# Patient Record
Sex: Female | Born: 2000 | Hispanic: No | Marital: Single | Smoking: Current some day smoker
Health system: Southern US, Community
[De-identification: ages and names within clinical notes are randomized; demographics above are authoritative.]

## PROBLEM LIST (undated history)

## (undated) DIAGNOSIS — L309 Dermatitis, unspecified: Secondary | ICD-10-CM

## (undated) DIAGNOSIS — J069 Acute upper respiratory infection, unspecified: Secondary | ICD-10-CM

## (undated) DIAGNOSIS — T783XXA Angioneurotic edema, initial encounter: Secondary | ICD-10-CM

## (undated) HISTORY — DX: Acute upper respiratory infection, unspecified: J06.9

## (undated) HISTORY — DX: Dermatitis, unspecified: L30.9

## (undated) HISTORY — DX: Angioneurotic edema, initial encounter: T78.3XXA

---
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
## 2021-01-23 ENCOUNTER — Emergency Department (HOSPITAL_COMMUNITY)
Admission: EM | Admit: 2021-01-23 | Discharge: 2021-01-23 | Disposition: A | Discharge status: Home / Self Care | Payer: Medicaid Other | Attending: Emergency Medicine | Admitting: Emergency Medicine

## 2021-01-23 ENCOUNTER — Encounter (HOSPITAL_COMMUNITY): Payer: Self-pay | Admitting: Pharmacy Technician

## 2021-01-23 ENCOUNTER — Other Ambulatory Visit: Payer: Self-pay

## 2021-01-23 DIAGNOSIS — R519 Headache, unspecified: Secondary | ICD-10-CM | POA: Diagnosis not present

## 2021-01-23 DIAGNOSIS — G43909 Migraine, unspecified, not intractable, without status migrainosus: Secondary | ICD-10-CM | POA: Diagnosis not present

## 2021-01-23 DIAGNOSIS — F17200 Nicotine dependence, unspecified, uncomplicated: Secondary | ICD-10-CM | POA: Diagnosis not present

## 2021-01-23 DIAGNOSIS — R42 Dizziness and giddiness: Secondary | ICD-10-CM | POA: Diagnosis not present

## 2021-01-23 DIAGNOSIS — H538 Other visual disturbances: Secondary | ICD-10-CM | POA: Diagnosis not present

## 2021-01-23 DIAGNOSIS — G4489 Other headache syndrome: Secondary | ICD-10-CM | POA: Diagnosis not present

## 2021-01-23 LAB — I-STAT BETA HCG BLOOD, ED (MC, WL, AP ONLY): I-stat hCG, quantitative: <5 m[IU]/mL (ref <5)

## 2021-01-23 MED ORDER — ACETAMINOPHEN 325 MG PO TABS
650.0000 mg | ORAL_TABLET | Freq: Four times a day (QID) | ORAL | Status: DC | PRN
  Administered 2021-01-23: 650 mg via ORAL
  Filled 2021-01-23: qty 2

## 2021-01-23 NOTE — Discharge Instructions (Addendum)
Tylenol for headache.  Drink plenty of water

## 2021-01-23 NOTE — ED Provider Notes (Signed)
Emergency Medicine Provider Triage Evaluation Note  April Lara , a 20 y.o. female  was evaluated in triage.  Pt complains of right-sided dull headache that has waxed and waned since yesterday. Denies visual changes, speech changes, and unilateral weakness. No history of headaches/migraines. No fever. No neck stiffness. No recent head trauma. Denies any numbness/tingling. She has been taking Tylenol with mild relief, last dose 3PM yesterday.  Review of Systems  Positive: headache Negative: fever  Physical Exam  There were no vitals taken for this visit. Gen:   Awake, no distress   Resp:  Normal effort  MSK:   Moves extremities without difficulty  Other:    Medical Decision Making  Medically screening exam initiated at 12:44 PM.  Appropriate orders placed.  Lashundra Shiveley was informed that the remainder of the evaluation will be completed by another provider, this initial triage assessment does not replace that evaluation, and the importance of remaining in the ED until their evaluation is complete.  Headache. Neurological exam grossly normal; however will need more thorough neurological exam when in a room. Pregnancy test for migraine cocktail in the back. Low suspicion for [facility] or emergent intracranial etiology.    Mannie Stabile, PA-C 01/23/21 1246    Arby Barrette, MD 02/03/21 1234

## 2021-01-23 NOTE — ED Notes (Signed)
Pt A&ox4, ambulatory at d/c with independent steady gait, NAD. Pt verbalized understanding of d/c instructions and follow up care. 

## 2021-01-23 NOTE — ED Provider Notes (Signed)
[facility] EMERGENCY DEPARTMENT Provider Note   CSN: [id number] Arrival date & time: 01/23/21  1216     History No chief complaint on file.   April Lara is a 20 y.o. female.  The history is provided by the patient. No language interpreter was used.  Headache Pain location:  Generalized Radiates to:  Does not radiate Onset quality:  Gradual Timing:  Constant Progression:  Worsening Chronicity:  New Ineffective treatments:  None tried Pt reports she had a headache yesterday and took tylenol.,  Pt reports headahce again today.  Pt reports her teacher called EMS.     History reviewed. No pertinent past medical history.  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 No pertinent surgical history.   OB History   No obstetric history on file.     No family history on file.  Social History   Tobacco Use   Smoking status: Some Days    Pack years: 0.00   Smokeless tobacco: Current   Tobacco comments:    vapes  Substance Use Topics   Alcohol use: Never   Drug use: Never    Home Medications Prior to Admission medications   Medication Sig Start Date End Date Taking? Authorizing Provider  cetirizine (ZYRTEC) 10 MG tablet TAKE 1 TABLET BY MOUTH EVERY DAY AS NEEDED FOR ALLERGY 12/10/20   Lady Deutscher, MD  fluticasone [facility]) 50 MCG/ACT nasal spray Place 1-2 sprays into both nostrils daily. 1 spray in each nostril every day Patient not taking: Reported on 11/26/2020 04/13/20   Romeo Apple, MD  JUNEL FE 1.5/30 1.5-30 MG-MCG tablet TAKE 1 TABLET BY  MOUTH EVERY DAY 10/08/20   Verneda Skill, FNP  triamcinolone ointment (KENALOG) 0.1 % Apply 1 application topically 2 (two) times daily. Apply over rough patches of skin, for no more than 14 days total Patient not taking: Reported on 11/26/2020 04/13/20   Romeo Apple, MD    Allergies    Patient has no known allergies.  Review of Systems   Review of Systems  Neurological:  Positive for headaches.  All other systems reviewed and are negative.  Physical Exam Updated Vital Signs BP 115/82 (BP Location: Right Arm)   Pulse 81   Temp 98.8 F (37.1 C)   Resp 17   SpO2 100%   Physical Exam Vitals and nursing note reviewed.  Constitutional:      Appearance: She is well-developed.  HENT:     Head: Normocephalic.     Right Ear: Tympanic membrane normal.     Left Ear: Tympanic membrane normal.     Nose: Nose normal.     Mouth/Throat:     Mouth: Mucous membranes are moist.  Eyes:     Extraocular Movements: Extraocular movements intact.     Pupils: Pupils are equal, round, and reactive to light.  Cardiovascular:     Rate and Rhythm: Normal rate and regular rhythm.  Pulmonary:     Effort: Pulmonary effort is normal.  Abdominal:     General: Abdomen is flat. There is no distension.  Musculoskeletal:  General: Normal range of motion.     Cervical back: Normal range of motion.  Skin:    General: Skin is warm.  Neurological:     General: No focal deficit present.     Mental Status: She is alert and oriented to person, place, and time.    ED Results / Procedures / Treatments   Labs (all labs ordered are listed, but only abnormal results are displayed) Labs Reviewed  I-STAT BETA HCG BLOOD, ED ([facility], [facility], [facility] ONLY)    EKG None  Radiology No results found.  Procedures Procedures   Medications Ordered in ED Medications  acetaminophen (TYLENOL) tablet 650 mg (650 mg Oral Given 01/23/21 1718)    ED Course  I have reviewed the triage vital signs and the nursing  notes.  Pertinent labs & imaging results that were available during my care of the patient were reviewed by me and considered in my medical decision making (see chart for details).    MDM Rules/Calculators/A&P                          MDM:  Pt looks good.  Pt given tylenol.  I advised follow up with primary care  Final Clinical Impression(s) / ED Diagnoses Final diagnoses:  Nonintractable headache, unspecified chronicity pattern, unspecified headache type    Rx / DC Orders ED Discharge Orders     None        Osie Cheeks 01/23/21 2330    Lorre Nick, MD 01/24/21 2254

## 2021-01-23 NOTE — ED Triage Notes (Addendum)
Pt bib EMS here with headache onset yesterday. Pt states numbness to R face along with dizziness and blurred vision to R eye yesterday. Today pt states headache is still there but numbness has resolved except for her R eyebrow. Pt with hx of same type of headaches but has never been evaluated by a provider.

## 2021-01-23 NOTE — ED Provider Notes (Signed)
[facility] EMERGENCY DEPARTMENT Provider Note   CSN: [id number] Arrival date & time: 01/23/21  1216     History No chief complaint on file.   April Lara is a 20 y.o. female.  Pt reports she had a headache yesterday.  Pt reports headache resolved with tylenol. Pt reports she developed a headache at school today.  Pt reports her teacher called EMS.  Pt states she just wants to go home.  Pt denies any weakness. No visual change.   The history is provided by the patient. No language interpreter was used.  Headache Pain location:  Generalized Radiates to:  Does not radiate Severity currently:  Unable to specify Severity at highest:  Unable to specify Timing:  Constant Chronicity:  New Relieved by:  Nothing Worsened by:  Nothing Ineffective treatments:  None tried Associated symptoms: no URI       History reviewed. No pertinent past medical history.  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 No pertinent surgical history.   OB History   No obstetric history on file.     No family history on file.  Social History   Tobacco Use   Smoking status: Some Days    Pack years: 0.00   Smokeless tobacco: Current   Tobacco comments:    vapes  Substance Use Topics   Alcohol use: Never   Drug use: Never    Home Medications Prior to Admission medications   Medication Sig Start Date End Date Taking? Authorizing Provider  cetirizine (ZYRTEC) 10 MG tablet TAKE 1 TABLET BY MOUTH EVERY DAY AS NEEDED FOR ALLERGY 12/10/20   Lady Deutscher, MD  fluticasone  [facility]) 50 MCG/ACT nasal spray Place 1-2 sprays into both nostrils daily. 1 spray in each nostril every day Patient not taking: Reported on 11/26/2020 04/13/20   Romeo Apple, MD  JUNEL FE 1.5/30 1.5-30 MG-MCG tablet TAKE 1 TABLET BY MOUTH EVERY DAY 10/08/20   Verneda Skill, FNP  triamcinolone ointment (KENALOG) 0.1 % Apply 1 application topically 2 (two) times daily. Apply over rough patches of skin, for no more than 14 days total Patient not taking: Reported on 11/26/2020 04/13/20   Romeo Apple, MD    Allergies    Patient has no known allergies.  Review of Systems   Review of Systems  Neurological:  Positive for headaches.  All other systems reviewed and are negative.  Physical Exam Updated Vital Signs BP 115/82 (BP Location: Right Arm)   Pulse 81   Temp 98.8 F (37.1 C)   Resp 17   SpO2 100%   Physical Exam Vitals and nursing note reviewed.  Constitutional:      Appearance: She is well-developed.  HENT:     Head: Normocephalic.  Cardiovascular:     Rate and Rhythm: Normal rate and regular rhythm.  Pulmonary:     Effort: Pulmonary effort is normal.  Abdominal:     General: Abdomen is flat. There is no distension.  Musculoskeletal:        General: Normal range of motion.     Cervical back: Normal range  of motion.  Skin:    General: Skin is warm.  Neurological:     General: No focal deficit present.     Mental Status: She is alert and oriented to person, place, and time.  Psychiatric:        Mood and Affect: Mood normal.    ED Results / Procedures / Treatments   Labs (all labs ordered are listed, but only abnormal results are displayed) Labs Reviewed  I-STAT BETA HCG BLOOD, ED ([facility], [facility], [facility] ONLY)    EKG None  Radiology No results found.  Procedures Procedures   Medications Ordered in ED Medications - No data to display  ED Course  I have reviewed the triage vital signs and the nursing notes.  Pertinent labs & imaging results that were available  during my care of the patient were reviewed by me and considered in my medical decision making (see chart for details).    MDM Rules/Calculators/A&P                          MDM: Pt given tylenol.  Pt looks well Final Clinical Impression(s) / ED Diagnoses Final diagnoses:  Nonintractable headache, unspecified chronicity pattern, unspecified headache type    Rx / DC Orders ED Discharge Orders     None     An After Visit Summary was printed and given to the patient.    Osie Cheeks 01/23/21 2326    Lorre Nick, MD 01/24/21 [phone]

## 2021-01-24 ENCOUNTER — Telehealth: Payer: Self-pay | Admitting: *Deleted

## 2021-01-24 NOTE — Telephone Encounter (Signed)
Transition Care Management Unsuccessful Follow-up Telephone Call  Date of discharge and from where:  01/23/2021 [facility]  Attempts:  1st Attempt  Reason for unsuccessful TCM follow-up call:  Left voice message

## 2021-01-25 NOTE — Telephone Encounter (Signed)
Transition Care Management Unsuccessful Follow-up Telephone Call  Date of discharge and from where:  01/23/2021 [facility]  Attempts:  2nd Attempt  Reason for unsuccessful TCM follow-up call:  Left voice message

## 2021-01-28 ENCOUNTER — Ambulatory Visit (INDEPENDENT_AMBULATORY_CARE_PROVIDER_SITE_OTHER): Payer: Medicaid Other | Admitting: Allergy

## 2021-01-28 ENCOUNTER — Encounter: Payer: Self-pay | Admitting: Allergy

## 2021-01-28 ENCOUNTER — Other Ambulatory Visit: Payer: Self-pay

## 2021-01-28 VITALS — BP 104/62 | HR 92 | Temp 98.2°F | Resp 16

## 2021-01-28 DIAGNOSIS — H1013 Acute atopic conjunctivitis, bilateral: Secondary | ICD-10-CM | POA: Diagnosis not present

## 2021-01-28 DIAGNOSIS — J3089 Other allergic rhinitis: Secondary | ICD-10-CM

## 2021-01-28 DIAGNOSIS — L309 Dermatitis, unspecified: Secondary | ICD-10-CM

## 2021-01-28 MED ORDER — LEVOCETIRIZINE DIHYDROCHLORIDE 5 MG PO TABS: 5.0000 mg | ORAL_TABLET | Freq: Every evening | ORAL | 5 refills | Status: DC

## 2021-01-28 NOTE — Assessment & Plan Note (Signed)
Dry/rash on lips for 1 month and concern whether this new type of [place] food could have triggered this episode.  Tried Zyrtec, Benadryl and Burt's bees Chapstick with no benefit.  Denies any changes in personal care products or infections.  Discussed with patient that given the timeline this rash was most likely not caused by any of the foods that she was eating.  More concerning for contact irritation. . See below for proper skin care. . Use Vanicream ointment or plain Vaseline ointment for the lips.

## 2021-01-28 NOTE — Progress Notes (Signed)
New Patient Note  RE: April Lara MRN: [id number] DOB: [birth year] Date of Office Visit: 01/28/2021  Consult requested by: Edwena Felty, MD Primary care provider: Clifton Custard, MD  Chief Complaint: Allergic Reaction  History of Present Illness: I had the pleasure of seeing April Lara for initial evaluation at the [facility] on 01/28/2021. She is a 20 y.o. female, who is referred here by Ettefagh, Aron Baba, MD for the evaluation of lip rash and allergic rhinitis. She is accompanied today by her mother who provided/contributed to the history. [name] interpreter present in person.   Lips:  About 2 months ago patient noted some dry lips after eating [place] food. Her boyfriend bought this which had steak, white rice, broccoli. She never had this type of food before.  Symptoms started the following day with dry lips, itching in the throat, slight lip swelling and burning eyes. Symptoms of the dry rashy lip persisted for about 1 month. She took zyrtec and benadryl with no benefit. She used Burt's bees chapstick.   Denies any fevers, chills, changes in medications, personal care products or recent infections.  This was the first time she had this happen.   Patient had steak, rice and broccoli since then without any issues.   Dietary History: patient has been eating other foods including milk, eggs, peanut, treenuts, sesame, shellfish, fish, soy, wheat, meats, fruits and vegetables.   Rhinitis:  She reports symptoms of rhinorrhea, sneezing, nasal congestion, watery/dry eyes - wears contacts. Symptoms have been going on for many years. The symptoms are present in the spring and fall. Anosmia: no. Headache: yes. She has used zyrtec, Flonase with some improvement in symptoms. Sinus infections: yes 2-3 per year but not requiring antibiotics. Previous work up includes: none. Previous ENT evaluation: yes Previous sinus imaging:  none. History of nasal polyps: no. Last eye exam: March 2022.   11/26/2020 PCP visit: "2. Allergic rhinitis/Allergy symptoms - Chronic allergy symptoms with known septum deviation with ENT eval in 2019. No consistent regimen. Also reports of lip rash in the setting of new food. Discussed indication for allergy referral for further testing. Reviewed supportive care measures for bloody nose and reasons to seek care. Advised to restart Zyrtec and f/u in 1 week if symptoms not improved.  - Ambulatory referral to Allergy"  Assessment and Plan: April Lara is a 20 y.o. female with: Other allergic rhinitis Rhinoconjunctivitis symptoms mainly in the spring and fall.  Tried Zyrtec and Flonase with some benefit.  No prior allergy testing.  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 Today's skin testing showed: Positive to grass, weed pollen, ragweed, trees, dust mites, cat and borderline to dog.  Start environmental control measures as below. Use over the counter antihistamines such as Zyrtec (cetirizine), Claritin (loratadine), Allegra (fexofenadine), or Xyzal (levocetirizine) daily as needed. May take twice a day during allergy flares. May switch antihistamines every few months. Use Flonase (fluticasone) nasal spray 1 spray per nostril twice a day as needed for nasal congestion.  Use over the counter refresh eye drops.  Consider allergy injections for long term control if above medications do not help the symptoms - handout given.   Allergic conjunctivitis of both eyes See assessment and plan as above.  Dermatitis of lip Dry/rash on lips for 1 month and concern whether this new type of [place] food could have triggered this episode.  Tried Zyrtec, Benadryl and Burt's bees Chapstick with no benefit.  Denies any changes in personal care  products or infections. Discussed with patient that given the timeline this rash was most likely not caused by any of the foods that she was eating.  More concerning for  contact irritation. See below for proper skin care. Use Vanicream ointment or plain Vaseline ointment for the lips.   Return in about 6 months (around 07/30/2021).  Meds ordered this encounter  Medications   levocetirizine (XYZAL) 5 MG tablet    Sig: Take 1 tablet (5 mg total) by mouth every evening.    Dispense:  30 tablet    Refill:  5   Lab Orders  No laboratory test(s) ordered today    Other allergy screening: Asthma: no Medication allergy: no Hymenoptera allergy: no Urticaria: no Eczema:yes - poplitea fossa and ribcage.  Uses triamcinolone prn with some benefit. History of recurrent infections suggestive of immunodeficency: no  Diagnostics: Skin Testing: Environmental allergy panel. Positive to grass, weed pollen, ragweed, trees, dust mites, cat and borderline to dog.  Results discussed with patient/family.  Airborne Adult Perc - 01/28/21 1527     Time Antigen Placed 1527    Allergen Manufacturer Waynette ButteryGreer    Location Back    Number of Test 59    Panel 1 Select    1. Control-Buffer 50% Glycerol Negative    2. Control-Histamine 1 mg/ml 2+    3. Albumin saline Negative    4. [place] 4+    5. [place] 4+    6. Johnson 4+    7. [place] Blue 3+    8. Meadow Fescue 4+    9. Perennial Rye 2+    10. Sweet Vernal --   +/-   11. Timothy 3+    12. Cocklebur Negative    13. Burweed Marshelder 2+    14. Ragweed, short Negative    15. Ragweed, Giant 2+    16. Plantain,  English Negative    17. Lamb's Quarters Negative    18. Sheep Sorrell 2+    19. Rough Pigweed Negative    20. Marsh Elder, Rough Negative    21. Mugwort, Common Negative    22. Ash mix Negative    23. Birch mix Negative    24. Beech American 2+    25. Box, Elder 4+    26. Cedar, red Negative    27. Cottonwood, [place] Negative    28. Elm mix Negative    29. Hickory 2+    30. Maple mix 4+    31. Oak, [place] mix --   +/-   32. Pecan Pollen Negative    33. Pine mix Negative    34. Sycamore Eastern  2+    35. Walnut, Black Pollen Negative    36. Alternaria alternata Negative    37. Cladosporium Herbarum Negative    38. Aspergillus mix Negative    39. Penicillium mix Negative    40. Bipolaris sorokiniana (Helminthosporium) Negative    41. Drechslera spicifera (Curvularia) Negative    42. Mucor plumbeus Negative    43. Fusarium moniliforme Negative    44. Aureobasidium pullulans (pullulara) Negative    45. Rhizopus oryzae Negative    46. Botrytis cinera Negative    47. Epicoccum nigrum Negative    48. Phoma betae Negative    49. Candida Albicans Negative    50. Trichophyton mentagrophytes Negative    51. Mite, D Farinae  5,000 AU/ml Negative    52. Mite, D Pteronyssinus  5,000 AU/ml 2+    53. Cat Hair 10,000 BAU/ml 2+  54.  Dog Epithelia --   +/-   55. Mixed Feathers Negative    56. Horse Epithelia Negative    57. Cockroach, German Negative    58. Mouse Negative    59. 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 No pertinent surgical history. Medication List:  Current Outpatient Medications  Medication Sig Dispense Refill   fluticasone (FLONASE) 50 MCG/ACT nasal spray Place 1-2 sprays into both nostrils daily. 1 spray in each nostril every day 16 g 12   JUNEL FE 1.5/30 1.5-30 MG-MCG tablet TAKE 1  TABLET BY MOUTH EVERY DAY 84 tablet 3   levocetirizine (XYZAL) 5 MG tablet Take 1 tablet (5 mg total) by mouth every evening. 30 tablet 5   triamcinolone ointment (KENALOG) 0.1 % Apply 1 application topically 2 (two) times daily. Apply over rough patches of skin, for no more than 14 days total 80 g 3   No current facility-administered medications for this visit.   Allergies: No Known Allergies Social History: Social History   Socioeconomic History   Marital status: Single    Spouse name: Not on file   Number of children: Not on file   Years of education: Not on file   Highest education level: Not on file  Occupational History   Not on file  Tobacco Use   Smoking status: Some Days    Pack years: 0.00   Smokeless tobacco: Current   Tobacco comments:    vapes  Vaping Use   Vaping Use: Some days  Substance and Sexual Activity   Alcohol use: Never   Drug use: Never   Sexual activity: Not on file  Other Topics Concern   Not on file  Social History Narrative   ** Merged History Encounter **       Social Determinants of Health   Financial Resource Strain: Not on file  Food Insecurity: Not on file  Transportation Needs: Not on file  Physical Activity: Not on file  Stress: Not on file  Social Connections: Not on file   Lives in a 19 year old home. Smoking: occasional Occupation: front Insurance claims handler HistorySurveyor, minerals in the house: no Engineer, civil (consulting) in the family room: no Carpet in the bedroom: no Heating: electric Cooling: central Pet: yes 1 cat  x 1 yr  Family History: Family History  Problem Relation Age of Onset   Allergic rhinitis Father    Allergic rhinitis Sister    Allergic rhinitis Brother    Review of Systems  Constitutional:  Negative for appetite change, chills, fever and unexpected weight change.  HENT:  Negative for congestion and rhinorrhea.   Eyes:  Negative for itching.  Respiratory:  Negative for cough, chest tightness, shortness of  breath and wheezing.  Cardiovascular:  Negative for chest pain.  Gastrointestinal:  Negative for abdominal pain.  Genitourinary:  Negative for difficulty urinating.  Skin:  Positive for rash.  Allergic/Immunologic: Positive for environmental allergies.  Neurological:  Negative for headaches.  Objective: BP 104/62 (BP Location: Right Arm, Patient Position: Sitting, Cuff Size: Normal)   Pulse 92   Temp 98.2 F (36.8 C) (Temporal)   Resp 16   SpO2 99%  There is no height or weight on file to calculate BMI. Physical Exam Vitals and nursing note reviewed.  Constitutional:      Appearance: Normal appearance. She is well-developed.  HENT:     Head: Normocephalic and atraumatic.     Right Ear: External ear normal.     Left Ear: External ear normal.     Nose: Nose normal.     Mouth/Throat:     Mouth: Mucous membranes are moist.     Pharynx: Oropharynx is clear.  Eyes:     Conjunctiva/sclera: Conjunctivae normal.  Cardiovascular:     Rate and Rhythm: Normal rate and regular rhythm.     Heart sounds: Normal heart sounds. No murmur heard.   No friction rub. No gallop.  Pulmonary:     Effort: Pulmonary effort is normal.     Breath sounds: Normal breath sounds. No wheezing, rhonchi or rales.  Abdominal:     Palpations: Abdomen is soft.  Musculoskeletal:     Cervical back: Neck supple.  Skin:    General: Skin is warm.     Findings: No rash.  Neurological:     Mental Status: She is alert and oriented to person, place, and time.  Psychiatric:        Behavior: Behavior normal.  The plan was reviewed with the patient/family, and all questions/concerned were addressed.  It was my pleasure to see Oluwateniola today and participate in her care. Please feel free to contact me with any questions or concerns.  Sincerely,  Wyline Mood, DO Allergy & Immunology  [facility] office: [phone] [facility] office: [phone]

## 2021-01-28 NOTE — Assessment & Plan Note (Signed)
Rhinoconjunctivitis symptoms mainly in the spring and fall.  Tried Zyrtec and Flonase with some benefit.  No prior allergy testing.  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  Today's skin testing showed: Positive to grass, weed pollen, ragweed, trees, dust mites, cat and borderline to dog.   Start environmental control measures as below.  Use over the counter antihistamines such as Zyrtec (cetirizine), Claritin (loratadine), Allegra (fexofenadine), or Xyzal (levocetirizine) daily as needed. May take twice a day during allergy flares. May switch antihistamines every few months.  Use Flonase (fluticasone) nasal spray 1 spray per nostril twice a day as needed for nasal congestion.   Use over the counter refresh eye drops.   Consider allergy injections for long term control if above medications do not help the symptoms - handout given.

## 2021-01-28 NOTE — Patient Instructions (Addendum)
Today's skin testing showed: Positive to grass, weed pollen, ragweed, trees, dust mites, cat and borderline to dog.   Environmental allergies Start environmental control measures as below. Use over the counter antihistamines such as Zyrtec (cetirizine), Claritin (loratadine), Allegra (fexofenadine), or Xyzal (levocetirizine) daily as needed. May take twice a day during allergy flares. May switch antihistamines every few months. Use Flonase (fluticasone) nasal spray 1 spray per nostril twice a day as needed for nasal congestion.  Use over the counter refresh eye drops.  Consider allergy injections for long term control if above medications do not help the symptoms - handout given.   Skin: See below for proper skin care. Use Vanicream ointment or plain Vaseline ointment for the lips.  It sounds like you had some type of contact irritation rather than an allergic reaction to food.   Follow up in 6 months or sooner if needed.   Skin care recommendations  Bath time: Always use lukewarm water. AVOID very hot or cold water. Keep bathing time to 5-10 minutes. Do NOT use bubble bath. Use a mild soap and use just enough to wash the dirty areas. Do NOT scrub skin vigorously.  After bathing, pat dry your skin with a towel. Do NOT rub or scrub the skin.  Moisturizers and prescriptions:  ALWAYS apply moisturizers immediately after bathing (within 3 minutes). This helps to lock-in moisture. Use the moisturizer several times a day over the whole body. Good summer moisturizers include: Aveeno, CeraVe, Cetaphil. Good winter moisturizers include: Aquaphor, Vaseline, Cerave, Cetaphil, Eucerin, Vanicream. When using moisturizers along with medications, the moisturizer should be applied about one hour after applying the medication to prevent diluting effect of the medication or moisturize around where you applied the medications. When not using medications, the moisturizer can be continued twice daily as  maintenance.  Laundry and clothing: Avoid laundry products with added color or perfumes. Use unscented hypo-allergenic laundry products such as Tide free, Cheer free & gentle, and All free and clear.  If the skin still seems dry or sensitive, you can try double-rinsing the clothes. Avoid tight or scratchy clothing such as wool. Do not use fabric softeners or dyer sheets.  Reducing Pollen Exposure Pollen seasons: trees (spring), grass (summer) and ragweed/weeds (fall). Keep windows closed in your home and car to lower pollen exposure.  Install air conditioning in the bedroom and throughout the house if possible.  Avoid going out in dry windy days - especially early morning. Pollen counts are highest between 5 - 10 AM and on dry, hot and windy days.  Save outside activities for late afternoon or after a heavy rain, when pollen levels are lower.  Avoid mowing of grass if you have grass pollen allergy. Be aware that pollen can also be transported indoors on people and pets.  Dry your clothes in an automatic dryer rather than hanging them outside where they might collect pollen.  Rinse hair and eyes before bedtime. Control of House Dust Mite Allergen Dust mite allergens are a common trigger of allergy and asthma symptoms. While they can be found throughout the house, these microscopic creatures thrive in warm, humid environments such as bedding, upholstered furniture and carpeting. Because so much time is spent in the bedroom, it is essential to reduce mite levels there.  Encase pillows, mattresses, and box springs in special allergen-proof fabric covers or airtight, zippered plastic covers.  Bedding should be washed weekly in hot water (130 F) and dried in a hot dryer. Allergen-proof covers are available  for comforters and pillows that can't be regularly washed.  Wash the allergy-proof covers every few months. Minimize clutter in the bedroom. Keep pets out of the bedroom.  Keep humidity less  than 50% by using a dehumidifier or air conditioning. You can buy a humidity measuring device called a hygrometer to monitor this.  If possible, replace carpets with hardwood, linoleum, or washable area rugs. If that's not possible, vacuum frequently with a vacuum that has a HEPA filter. Remove all upholstered furniture and non-washable window drapes from the bedroom. Remove all non-washable stuffed toys from the bedroom.  Wash stuffed toys weekly. Pet Allergen Avoidance: Contrary to popular opinion, there are no "hypoallergenic" breeds of dogs or cats. That is because people are not allergic to an animal's hair, but to an allergen found in the animal's saliva, dander (dead skin flakes) or urine. Pet allergy symptoms typically occur within minutes. For some people, symptoms can build up and become most severe 8 to 12 hours after contact with the animal. People with severe allergies can experience reactions in public places if dander has been transported on the pet owners' clothing. Keeping an animal outdoors is only a partial solution, since homes with pets in the yard still have higher concentrations of animal allergens. Before getting a pet, ask your allergist to determine if you are allergic to animals. If your pet is already considered part of your family, try to minimize contact and keep the pet out of the bedroom and other rooms where you spend a great deal of time. As with dust mites, vacuum carpets often or replace carpet with a hardwood floor, tile or linoleum. High-efficiency particulate air (HEPA) cleaners can reduce allergen levels over time. While dander and saliva are the source of cat and dog allergens, urine is the source of allergens from rabbits, hamsters, mice and [place] pigs; so ask a non-allergic family member to clean the animal's cage. If you have a pet allergy, talk to your allergist about the potential for allergy immunotherapy (allergy shots). This strategy can often provide  long-term relief.

## 2021-01-28 NOTE — Telephone Encounter (Signed)
Transition Care Management Unsuccessful Follow-up Telephone Call  Date of discharge and from where:  01/23/2021 [facility]  Attempts:  3rd Attempt  Reason for unsuccessful TCM follow-up call:  Left voice message

## 2021-01-28 NOTE — Assessment & Plan Note (Signed)
.   See assessment and plan as above. 

## 2021-03-29 ENCOUNTER — Other Ambulatory Visit: Payer: Self-pay

## 2021-03-29 ENCOUNTER — Encounter (HOSPITAL_BASED_OUTPATIENT_CLINIC_OR_DEPARTMENT_OTHER): Payer: Self-pay | Admitting: Emergency Medicine

## 2021-03-29 ENCOUNTER — Emergency Department (HOSPITAL_BASED_OUTPATIENT_CLINIC_OR_DEPARTMENT_OTHER): Payer: Medicaid Other | Admitting: Radiology

## 2021-03-29 ENCOUNTER — Emergency Department (HOSPITAL_BASED_OUTPATIENT_CLINIC_OR_DEPARTMENT_OTHER)
Admission: EM | Admit: 2021-03-29 | Discharge: 2021-03-29 | Disposition: A | Discharge status: Home / Self Care | Payer: Medicaid Other | Attending: Emergency Medicine | Admitting: Emergency Medicine

## 2021-03-29 DIAGNOSIS — F17200 Nicotine dependence, unspecified, uncomplicated: Secondary | ICD-10-CM | POA: Diagnosis not present

## 2021-03-29 DIAGNOSIS — R0789 Other chest pain: Secondary | ICD-10-CM | POA: Diagnosis not present

## 2021-03-29 DIAGNOSIS — R9431 Abnormal electrocardiogram [ECG] [EKG]: Secondary | ICD-10-CM | POA: Diagnosis not present

## 2021-03-29 DIAGNOSIS — R0602 Shortness of breath: Secondary | ICD-10-CM | POA: Diagnosis not present

## 2021-03-29 LAB — CBC WITH DIFFERENTIAL/PLATELET
Abs Immature Granulocytes: 0.01 10*3/uL (ref 0.00–0.07)
Basophils Absolute: 0.1 10*3/uL (ref 0.0–0.1)
Basophils Relative: 1 %
Eosinophils Absolute: 0.2 10*3/uL (ref 0.0–0.5)
Eosinophils Relative: 3 %
HCT: 41.3 % (ref 36.0–46.0)
Hemoglobin: 14.1 g/dL (ref 12.0–15.0)
Immature Granulocytes: 0 %
Lymphocytes Relative: 41 %
Lymphs Abs: 2.4 10*3/uL (ref 0.7–4.0)
MCH: 30 pg (ref 26.0–34.0)
MCHC: 34.1 g/dL (ref 30.0–36.0)
MCV: 87.9 fL (ref 80.0–100.0)
Monocytes Absolute: 0.4 10*3/uL (ref 0.1–1.0)
Monocytes Relative: 6 %
Neutro Abs: 2.9 10*3/uL (ref 1.7–7.7)
Neutrophils Relative %: 49 %
Platelets: 259 10*3/uL (ref 150–400)
RBC: 4.7 MIL/uL (ref 3.87–5.11)
RDW: 12.1 % (ref 11.5–15.5)
WBC: 5.9 10*3/uL (ref 4.0–10.5)
nRBC: 0 % (ref 0.0–0.2)

## 2021-03-29 LAB — D-DIMER, QUANTITATIVE: D-Dimer, Quant: 0.28 ug/mL-FEU (ref 0.00–0.50)

## 2021-03-29 LAB — BASIC METABOLIC PANEL
Anion gap: 8 (ref 5–15)
BUN: 6 mg/dL (ref 6–20)
CO2: 24 mmol/L (ref 22–32)
Calcium: 9.2 mg/dL (ref 8.9–10.3)
Chloride: 108 mmol/L (ref 98–111)
Creatinine, Ser: 0.59 mg/dL (ref 0.44–1.00)
GFR, Estimated: >60 mL/min (ref >60)
Glucose, Bld: 74 mg/dL (ref 70–99)
Potassium: 3.8 mmol/L (ref 3.5–5.1)
Sodium: 140 mmol/L (ref 135–145)

## 2021-03-29 MED ORDER — ALBUTEROL SULFATE HFA 108 (90 BASE) MCG/ACT IN AERS
2.0000 | INHALATION_SPRAY | Freq: Once | RESPIRATORY_TRACT | Status: AC
  Administered 2021-03-29: 2 via RESPIRATORY_TRACT
  Filled 2021-03-29: qty 6.7

## 2021-03-29 MED ORDER — PREDNISONE 50 MG PO TABS
60.0000 mg | ORAL_TABLET | Freq: Once | ORAL | Status: AC
  Administered 2021-03-29: 60 mg via ORAL
  Filled 2021-03-29: qty 1

## 2021-03-29 NOTE — ED Notes (Signed)
Pt ambulated in hallway, O2 sats stayed above 93% on RA

## 2021-03-29 NOTE — ED Notes (Signed)
Pt. Educated on indications for use of Albuterol Inhaler with/without Spacer device, able to teach back and demonstrate use.

## 2021-03-29 NOTE — ED Provider Notes (Signed)
[facility] EMERGENCY DEPT Provider Note   CSN: [id number] Arrival date & time: 03/29/21  1259     History Chief Complaint  Patient presents with   Shortness of Breath    April Lara is a 20 y.o. female.  Patient is a 20 year old female who presents with shortness of breath.  She said it came on suddenly last night about 8:00.  She feels tight across the top of her chest.  She says it feels tight when she takes a deep breath.  She has shortness of breath, primarily when she is up walking.  No other chest pain.  No leg pain or swelling.  No cough or cold symptoms.  No recent fevers.  No history of lung problems in the past.  She does vape occasionally but quit about a week ago.  She has a history of allergic rhinitis and was exposed to cats yesterday.  She took an allergy medicine but it did not seem to help.      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 No pertinent surgical history.   OB History   No obstetric history on file.     Family History  Problem Relation Age of Onset   Allergic rhinitis Father    Allergic rhinitis Sister    Allergic rhinitis Brother     Social History   Tobacco Use   Smoking status: Some Days   Smokeless tobacco: Current   Tobacco comments:    vapes  Vaping Use   Vaping Use: Some days  Substance Use Topics   Alcohol  use: Never   Drug use: Never    Home Medications Prior to Admission medications   Medication Sig Start Date End Date Taking? Authorizing Provider  fluticasone (FLONASE) 50 MCG/ACT nasal spray Place 1-2 sprays into both nostrils daily. 1 spray in each nostril every day 04/13/20   Romeo Apple, MD  JUNEL FE 1.5/30 1.5-30 MG-MCG tablet TAKE 1 TABLET BY MOUTH EVERY DAY 10/08/20   Verneda Skill, FNP  levocetirizine (XYZAL) 5 MG tablet Take 1 tablet (5 mg total) by mouth every evening. 01/28/21   Ellamae Sia, DO  triamcinolone ointment (KENALOG) 0.1 % Apply 1 application topically 2 (two) times daily. Apply over rough patches of skin, for no more than 14 days total 04/13/20   Chukwu, Dolores Patty, MD    Allergies    Patient has no known allergies.  Review of Systems   Review of Systems  Constitutional:  Negative for chills, diaphoresis, fatigue and fever.  HENT:  Negative for congestion, rhinorrhea and sneezing.   Eyes: Negative.   Respiratory:  Positive for chest tightness and shortness of breath. Negative for cough.   Cardiovascular:  Positive for chest pain. Negative for leg swelling.  Gastrointestinal:  Negative for abdominal pain, blood in stool, diarrhea, nausea and vomiting.  Genitourinary:  Negative for  difficulty urinating, flank pain, frequency and hematuria.  Musculoskeletal:  Negative for arthralgias and back pain.  Skin:  Negative for rash.  Neurological:  Negative for dizziness, speech difficulty, weakness, numbness and headaches.   Physical Exam Updated Vital Signs BP 119/69   Pulse 77   Temp 98.4 F (36.9 C) (Oral)   Resp 14   Ht 5\' 2"  (1.575 m)   Wt 42.7 kg   LMP 03/22/2021 (Approximate)   SpO2 100%   BMI 17.22 kg/m   Physical Exam Constitutional:      Appearance: She is well-developed.  HENT:     Head: Normocephalic and atraumatic.  Eyes:     Pupils: Pupils are equal, round, and reactive to light.  Cardiovascular:     Rate and Rhythm: Normal rate and regular  rhythm.     Heart sounds: Normal heart sounds.  Pulmonary:     Effort: Pulmonary effort is normal. No respiratory distress.     Breath sounds: Normal breath sounds. No wheezing or rales.  Chest:     Chest wall: No tenderness.  Abdominal:     General: Bowel sounds are normal.     Palpations: Abdomen is soft.     Tenderness: There is no abdominal tenderness. There is no guarding or rebound.  Musculoskeletal:        General: Normal range of motion.     Cervical back: Normal range of motion and neck supple.     Comments: No edema or calf tenderness  Lymphadenopathy:     Cervical: No cervical adenopathy.  Skin:    General: Skin is warm and dry.     Findings: No rash.  Neurological:     Mental Status: She is alert and oriented to person, place, and time.    ED Results / Procedures / Treatments   Labs (all labs ordered are listed, but only abnormal results are displayed) Labs Reviewed  BASIC METABOLIC PANEL  CBC WITH DIFFERENTIAL/PLATELET  D-DIMER, QUANTITATIVE    EKG EKG Interpretation  Date/Time:  Friday March 29 2021 13:17:16 EDT Ventricular Rate:  94 PR Interval:  136 QRS Duration: 72 QT Interval:  332 QTC Calculation: 415 R Axis:   78 Text Interpretation: Normal sinus rhythm Normal ECG No old tracing to compare Confirmed by 05-27-1969 [phone]) on 03/29/2021 1:24:01 PM  Radiology DG Chest 2 View  Result Date: 03/29/2021 CLINICAL DATA:  Breath, chest tightness EXAM: CHEST - 2 VIEW COMPARISON:  11/15/2014 FINDINGS: Normal heart size, mediastinal contours, and pulmonary vascularity. Lungs clear. No pulmonary infiltrate, pleural effusion, or pneumothorax. Osseous structures unremarkable. IMPRESSION: Normal exam. Electronically Signed   By: 01/15/2015 M.D.   On: 03/29/2021 13:48    Procedures Procedures   Medications Ordered in ED Medications  albuterol (VENTOLIN HFA) 108 (90 Base) MCG/ACT inhaler 2 puff (2 puffs Inhalation Given 03/29/21 1532)  predniSONE  (DELTASONE) tablet 60 mg (60 mg Oral Given 03/29/21 1727)    ED Course  I have reviewed the triage vital signs and the nursing notes.  Pertinent labs & imaging results that were available during my care of the patient were reviewed by me and considered in my medical decision making (see chart for details).    MDM Rules/Calculators/A&P                           Patient presents with shortness of breath.  She has no hypoxia.  Her lungs initially sounded a little bit decreased.  She was given albuterol inhaler and felt much better after this.  She has no evidence of pneumonia on chest x-ray.  No pneumothorax.  Her D-dimer is normal and she does not have other symptoms that would be more concerning for PE.  She was able to ambulate without hypoxia.  It is possible she has some reactive airway disease from the cat exposure.  She has a known allergy to cats.  She was discharged home in good condition.  She was given dose of prednisone in the ED but I did not feel that she needed a full course.  She was given the albuterol inhaler to use at home.  She is otherwise well-appearing with no increased work of breathing.  She was encouraged to follow-up with her PCP.  Return precautions were given. Final Clinical Impression(s) / ED Diagnoses Final diagnoses:  Shortness of breath    Rx / DC Orders ED Discharge Orders     None        Rolan Bucco, MD 03/29/21 2014

## 2021-03-29 NOTE — ED Notes (Signed)
Patient adds she quit vaping on Monday that she has been using intermittently over the past year.

## 2021-03-29 NOTE — Discharge Instructions (Addendum)
Use the inhaler 2 puffs every 4-6 hours for the next few days and then just use it as you need to after that.  Follow-up with your doctor within the next few days.  Return here as needed if you have any worsening symptoms.

## 2021-03-29 NOTE — ED Triage Notes (Signed)
Patient c/o chest pain onset of 8pm last night. Thought it could be allergy related due to being around cats and she is allergic. Took some medicine then went to bed. Throughout the night and today having shortness of breath with some upper chest tightness. Also states she does not remember driving home from school today.

## 2021-04-01 ENCOUNTER — Telehealth: Payer: Self-pay

## 2021-04-01 NOTE — Telephone Encounter (Signed)
Transition Care Management Unsuccessful Follow-up Telephone Call  Date of discharge and from where:  03/29/2021-[facility]   Attempts:  1st Attempt  Reason for unsuccessful TCM follow-up call:  Unable to leave message

## 2021-04-02 NOTE — Telephone Encounter (Signed)
Transition Care Management Unsuccessful Follow-up Telephone Call  Date of discharge and from where:  03/29/2021 from [facility]  Attempts:  2nd Attempt  Reason for unsuccessful TCM follow-up call:  Unable to leave message

## 2021-04-03 NOTE — Telephone Encounter (Addendum)
Transition Care Management Unsuccessful Follow-up Telephone Call  Date of discharge and from where:  03/29/2021-[facility]  Attempts:  3rd Attempt  Reason for unsuccessful TCM follow-up call:  Unable to leave message

## 2021-04-05 ENCOUNTER — Other Ambulatory Visit: Payer: Self-pay

## 2021-04-05 ENCOUNTER — Ambulatory Visit (INDEPENDENT_AMBULATORY_CARE_PROVIDER_SITE_OTHER): Payer: Medicaid Other | Admitting: Pediatrics

## 2021-04-05 VITALS — HR 119 | Temp 98.3°F | Wt 94.8 lb

## 2021-04-05 DIAGNOSIS — J383 Other diseases of vocal cords: Secondary | ICD-10-CM | POA: Diagnosis not present

## 2021-04-05 DIAGNOSIS — R0681 Apnea, not elsewhere classified: Secondary | ICD-10-CM

## 2021-04-05 NOTE — Patient Instructions (Signed)
Thank you for coming in today.  We discussed possible vocal cord dysfunction. You filled out a questionnarie that is highly suggestive of this issue.  The treatment is supportive. Please read the handout. You can practice deep breathing techniques and relaxation techniques for this. Panting can help discontinue an episode of these symptoms.  If this continues to be an issue we can consider referring you to a speech therapist and behavioral therapist.  Stop using the albuterol.  If your lips are blue again and you feel you are not getting oxygen go to ED.  Follow up with your PCP as scheduled or sooner if needed.   Dr. Salvadore Dom

## 2021-04-05 NOTE — Progress Notes (Addendum)
Subjective:     April Lara, is a 20 y.o. female   History provider by patient No interpreter necessary.  Chief Complaint  Patient presents with   Follow-up    Using albuterol prn with good relief.     HPI: Seen in ED 8/19 for shortness of breath and patient reported perioral cyanosis after cat exposure. Discharged with albuterol that she uses every 5 hours. Known allergy to cats. Was grooming boyfriend's cat which is a long haired cat. She encounters this cat 3 days out of the week and has a cat of her own. She does not feel as if his home is as clean as hers. She continues to use the inhaler despite wheezing because her throat feels as if there is something in it and becomes scratchy if she does not use the albuterol. She did not need the use of an inhaler prior to the ED visit. She does not have a hx of asthma. Endorses palpitations during these episodes.   Of note, she was also vaping prior to her ED visit.   Review of Systems  Constitutional:  Negative for activity change.  HENT:  Negative for congestion.   Respiratory:  Negative for cough, chest tightness, shortness of breath, wheezing and stridor.   Cardiovascular:  Positive for palpitations.  Skin:  Negative for color change.    Patient's history was reviewed and updated as appropriate: allergies, current medications, past medical history, past social history, and problem list.     Objective:     Pulse (!) 119   Temp 98.3 F (36.8 C) (Oral)   Wt 94 lb 12.8 oz (43 kg)   LMP 03/22/2021 (Approximate)   SpO2 97%   BMI 17.34 kg/m   Physical Exam Vitals and nursing note reviewed.  Constitutional:      General: She is not in acute distress.    Appearance: Normal appearance. She is not ill-appearing or toxic-appearing.  HENT:     Head: Normocephalic.     Right Ear: External ear normal.     Left Ear: External ear normal.     Nose: Nose normal.     Mouth/Throat:     Pharynx: No oropharyngeal  exudate or posterior oropharyngeal erythema.  Eyes:     Conjunctiva/sclera: Conjunctivae normal.  Cardiovascular:     Rate and Rhythm: Normal rate and regular rhythm.     Heart sounds: Normal heart sounds.  Pulmonary:     Effort: Pulmonary effort is normal.     Breath sounds: Normal breath sounds.  Neurological:     Mental Status: She is alert.  Media Information Document Information  Photos  VCDQ  04/05/2021 09:44  Attached To:  Office Visit on 04/05/21 with Verlon Setting, MD   Source Information  Autry-Lott, Randa Evens, DO  [facility]       Assessment & Plan:   Vocal cord dysfunction/Inducible laryngeal obstruction  April Lara is a 20 yo F w/ PMHx of anxiety, depression, eczema, and seasonal allergies presents for ED follow up. She was seen 8/19 for shortness of breath and was initially thought to be RAD from cat hair exposure. She was given an albuterol inhaler and she can continued use for the last 8 days every 5 hours for throat irritation. She has not experienced dyspnea since her ED visit. Today she appears well and no wheezing or stridor on exam today. With hx of throat irritation every few hours and palpitations. We considered anxiety induced somatic symptoms v. Vocal  cord dysfunction. VCD questionnaire as above revealed a score that is highly suggestive of vocal cord dysfunction. There may also be a supratentorial component to this as well given hx of anxiety. Discussed treatment of avoiding irritants such as vaping and excessive cat hair specifically for this patient, as well as breathing techniques such as panting to abate these episodes. She voice understanding. We also discussed considering speech/behavioral therapy if no improvement with home techniques. Otherwise follow up with PCP 10/25.   Supportive care and return precautions reviewed.  No follow-ups on file.  April Lara Autry-Lott, DO

## 2021-06-04 ENCOUNTER — Encounter: Payer: Self-pay | Admitting: Pediatrics

## 2021-06-04 ENCOUNTER — Other Ambulatory Visit (HOSPITAL_COMMUNITY)
Admission: RE | Admit: 2021-06-04 | Discharge: 2021-06-04 | Disposition: A | Discharge status: Home / Self Care | Payer: Medicaid Other | Source: Ambulatory Visit | Attending: Pediatrics | Admitting: Pediatrics

## 2021-06-04 ENCOUNTER — Ambulatory Visit (INDEPENDENT_AMBULATORY_CARE_PROVIDER_SITE_OTHER): Payer: Medicaid Other | Admitting: Pediatrics

## 2021-06-04 ENCOUNTER — Other Ambulatory Visit: Payer: Self-pay

## 2021-06-04 VITALS — BP 110/74 | Ht 61.54 in | Wt 95.5 lb

## 2021-06-04 DIAGNOSIS — Z30011 Encounter for initial prescription of contraceptive pills: Secondary | ICD-10-CM

## 2021-06-04 DIAGNOSIS — Z23 Encounter for immunization: Secondary | ICD-10-CM

## 2021-06-04 DIAGNOSIS — Z114 Encounter for screening for human immunodeficiency virus [HIV]: Secondary | ICD-10-CM

## 2021-06-04 DIAGNOSIS — J309 Allergic rhinitis, unspecified: Secondary | ICD-10-CM | POA: Diagnosis not present

## 2021-06-04 DIAGNOSIS — Z68.41 Body mass index (BMI) pediatric, less than 5th percentile for age: Secondary | ICD-10-CM

## 2021-06-04 DIAGNOSIS — Z113 Encounter for screening for infections with a predominantly sexual mode of transmission: Secondary | ICD-10-CM | POA: Diagnosis not present

## 2021-06-04 DIAGNOSIS — Z0001 Encounter for general adult medical examination with abnormal findings: Secondary | ICD-10-CM | POA: Diagnosis not present

## 2021-06-04 DIAGNOSIS — L309 Dermatitis, unspecified: Secondary | ICD-10-CM

## 2021-06-04 LAB — POCT RAPID HIV: Rapid HIV, POC: NEGATIVE

## 2021-06-04 MED ORDER — CETIRIZINE HCL 10 MG PO TABS: 10.0000 mg | ORAL_TABLET | Freq: Every day | ORAL | 11 refills | Status: DC

## 2021-06-04 MED ORDER — FLUTICASONE PROPIONATE 50 MCG/ACT NA SUSP: 1.0000 | Freq: Every day | NASAL | 12 refills | Status: AC

## 2021-06-04 MED ORDER — TRIAMCINOLONE ACETONIDE 0.1 % EX OINT: 1.0000 "application " | TOPICAL_OINTMENT | Freq: Two times a day (BID) | CUTANEOUS | 4 refills | Status: DC

## 2021-06-04 MED ORDER — NORGESTIMATE-ETH ESTRADIOL 0.25-35 MG-MCG PO TABS: 1.0000 | ORAL_TABLET | Freq: Every day | ORAL | 11 refills | Status: DC

## 2021-06-04 NOTE — Patient Instructions (Signed)
Adult Primary Care Clinics Name Criteria Services   [facility]  Address: [address] [place], [place] [address]  Phone: [phone] Hours: Monday - Friday 9 AM -6 PM  Types of insurance accepted:  Commercial insurance [facility] (orange card) Medicaid Medicare Uninsured  Language services:  Video and phone interpreters available   Ages 18 and older    Adult primary care Onsite pharmacy Integrated behavioral health Financial assistance counseling Walk-in hours for established patients  Financial assistance counseling hours: Tuesdays 2:00PM - 5:00PM  Thursday 8:30AM - 4:30PM  Space is limited, 10 on Tuesday and 20 on Thursday on a first come, first serve basis  Name Criteria Services   [facility]  Address: [address] [place], [place] [address]  Phone: [phone]  Hours: Monday - Friday 8:30 AM - 5 PM  Types of insurance accepted:  Commercial insurance Medicaid Medicare Uninsured  Language services:  Video and phone interpreters available   All ages - newborn to adult   Primary care for all ages (children and adults) Integrated behavioral health Nutritionist Financial assistance counseling   Name Criteria Services   [facility]  Located on the ground floor of  Hospital  Address: [address]  [place],  [place]  [address]  Phone: [phone]  Hours: Monday - Friday 8:15 AM - 5 PM  Types of insurance accepted:  Commercial insurance Medicaid Medicare Uninsured  Language services:  Video and phone interpreters available   Ages 18 and older   Adult primary care Nutritionist Certified Diabetes Educator  Integrated behavioral health Financial assistance counseling   Name Criteria Services   [facility]  Address: [address] [place], [place] [address]  Phone:  [phone]  Hours: Monday - Friday 8:30 AM - 5 PM    Types of insurance accepted:  Commercial insurance Medicaid Medicare Uninsured  Language services:  Video and phone interpreters available   All ages - newborn to adult   Primary care for all ages (children and adults) Integrated behavioral health Financial assistance counseling    

## 2021-06-04 NOTE — Progress Notes (Signed)
Adolescent Well Care Visit April Lara is a 20 y.o. female who is here for well care.    PCP:  Clifton Custard, MD   History was provided by the patient.  Confidentiality was discussed with the patient.   Current Issues: Current concerns include birth control questions - taking Junel Fe.  Feeling less sexual desire some month.  Also having some vaginal.  Would like to continue to take a birth control pill because she is used to that routine.    Needs refills on allergy medications - tablets and nasal spray.  Eczema - flares up behind knees and below armpits.  Improves with 2 days of triaminolone ointment.    Nutrition: Nutrition/Eating Behaviors: appetite is good, not picky Adequate calcium in diet?: yes Supplements/ Vitamins: none  Exercise: Play any Sports?/ Exercise: walking a lot, some body weight exercises at home  Sleep:  Sleep: difficulty staying up late after work to complete school work  Social Screening: Lives with:  parents and siblings Parental relations:  good Activities, Work, and Regulatory affairs officer?: working Psychologist, sport and exercise at [employer] of note: no  Education: 2nd at [employer], plans to transfer to [employer] to study Patent attorney next year.  Menstruation:   No LMP recorded. Menstrual History: regular - on OCPs   Confidential Social History: Tobacco?  vaping Secondhand smoke exposure?  no Drugs/ETOH?  no  Sexually Active?  Yes - 1 female partner (boyfriend of about 3 years) Pregnancy Prevention: condoms and OCPs  Screenings: Patient has a dental home: yes  The patient completed the Rapid Assessment for Adolescent Preventive Services screening questionnaire and the following topics were identified as risk factors and discussed: tobacco use  Additional topics were discussed as part of anticipatory guidance.  PHQ-9 completed and results indicated no signs of depression.  Physical Exam:  Vitals:   06/04/21 0928  BP: 110/74   Weight: 95 lb 8 oz (43.3 kg)  Height: 5' 1.54" (1.563 m)   BP 110/74 (BP Location: Right Arm, Patient Position: Sitting, Cuff Size: Normal)   Ht 5' 1.54" (1.563 m)   Wt 95 lb 8 oz (43.3 kg)   BMI 17.73 kg/m  Body mass index: body mass index is 17.73 kg/m.   Hearing Screening  Method: Audiometry   500Hz  1000Hz  2000Hz  4000Hz   Right ear 20 20 20 20   Left ear 20 20 20 20    Vision Screening   Right eye Left eye Both eyes  Without correction     With correction 20/20 20/20 20/20     General Appearance:   alert, oriented, no acute distress and well nourished  HENT: Normocephalic, no obvious abnormality, conjunctiva clear  Mouth:   Normal appearing teeth, no obvious discoloration, dental caries, or dental caps  Neck:   Supple; thyroid: no enlargement, symmetric, no tenderness/mass/nodules  Chest Exam deferred   Lungs:   Clear to auscultation bilaterally, normal work of breathing  Heart:   Regular rate and rhythm, S1 and S2 normal, no murmurs;   Abdomen:   Soft, non-tender, no mass, or organomegaly  GU genitalia not examined  Musculoskeletal:   Tone and strength strong and symmetrical, all extremities               Lymphatic:   No cervical adenopathy  Skin/Hair/Nails:   Skin warm, dry and intact, no rashes, no bruises or petechiae  Neurologic:   Strength, gait, and coordination normal and age-appropriate     Assessment and Plan:   1. Encounter for general  adult medical examination with abnormal findings  2. Routine screening for STI (sexually transmitted infection) - Urine cytology ancillary only - POCT Rapid HIV - negative  3. Body mass index, pediatric, less than 5th percentile for age Weight and BMI have increased slightly over the past year.  BMI now at 4th percentile for age.  4.  Encounter for oral contraception initial prescription Discussed contraceptive options including patch, pill, injection, implant, vaginal ring, and IUD.  Patient would like to continue with  a daily pill.  Will trial switching to a 3rd generation progesterone OCP given concern for side effects on current 1st generation progesterone. - norgestimate-ethinyl estradiol (SPRINTEC 28) 0.25-35 MG-MCG tablet; Take 1 tablet by mouth daily.  Dispense: 28 tablet; Refill: 11  5. Eczema, unspecified type Discussed supportive care with hypoallergenic soap/detergent and regular application of bland emollients.  Reviewed appropriate use of steroid creams and return precautions. - triamcinolone ointment (KENALOG) 0.1 %; Apply 1 application topically 2 (two) times daily. Apply over rough patches of skin, for no more than 14 days total  Dispense: 80 g; Refill: 4  6. Allergic rhinitis, unspecified seasonality, unspecified trigger Refills provided today - cetirizine (ZYRTEC) 10 MG tablet; Take 1 tablet (10 mg total) by mouth daily. For allergies  Dispense: 30 tablet; Refill: 11 - fluticasone (FLONASE) 50 MCG/ACT nasal spray; Place 1-2 sprays into both nostrils daily.  Dispense: 16 g; Refill: 12   Hearing screening result:normal Vision screening result: normal  Counseling provided for all of the vaccine components  Orders Placed This Encounter  Procedures   Flu Vaccine QUAD 42mo+IM (Fluarix, Fluzone & Alfiuria Quad PF)     Return if symptoms worsen or fail to improve.Clifton Custard, MD

## 2021-06-05 ENCOUNTER — Telehealth: Payer: Self-pay | Admitting: Pediatrics

## 2021-06-05 LAB — URINE CYTOLOGY ANCILLARY ONLY
Chlamydia: NEGATIVE
Comment: NEGATIVE
Comment: NORMAL
Neisseria Gonorrhea: NEGATIVE

## 2021-06-05 NOTE — Telephone Encounter (Signed)
Penni came in she wants to switch her birth control to the previous one she was getting not the new one if you can call her @ [phone]

## 2021-06-05 NOTE — Telephone Encounter (Signed)
April Lara was seen for PE yesterday; she also sent MyChart message this morning regarding preference for JunelFe.

## 2021-06-10 MED ORDER — NORETHINDRONE ACET-ETHINYL EST 1.5-30 MG-MCG PO TABS: 1.0000 | ORAL_TABLET | Freq: Every day | ORAL | 12 refills | Status: DC

## 2021-08-14 ENCOUNTER — Ambulatory Visit (INDEPENDENT_AMBULATORY_CARE_PROVIDER_SITE_OTHER): Payer: Medicaid Other | Admitting: Allergy

## 2021-08-14 ENCOUNTER — Encounter: Payer: Self-pay | Admitting: Allergy

## 2021-08-14 ENCOUNTER — Other Ambulatory Visit: Payer: Self-pay

## 2021-08-14 VITALS — BP 110/68 | HR 96 | Temp 98.3°F | Resp 20 | Ht 61.0 in | Wt 97.4 lb

## 2021-08-14 DIAGNOSIS — H1013 Acute atopic conjunctivitis, bilateral: Secondary | ICD-10-CM

## 2021-08-14 DIAGNOSIS — L299 Pruritus, unspecified: Secondary | ICD-10-CM

## 2021-08-14 DIAGNOSIS — H1010 Acute atopic conjunctivitis, unspecified eye: Secondary | ICD-10-CM

## 2021-08-14 NOTE — Progress Notes (Signed)
Follow Up Note  RE: April Lara MRN: [id number] DOB: 2001/05/19 Date of Office Visit: 08/14/2021  Referring provider: Clifton Custard, MD Primary care provider: Clifton Custard, MD  Chief Complaint: Eczema (Dry patches on her thighs, and in her armpits. She uses unscented dove and changes razor blades when time to shave. She uses a  very thick lotion and that seems to calm her eczema. Nivea creme ), Allergic Rhinitis  (No flares ), and Angioedema (No issues )  History of Present Illness: I had the pleasure of seeing April Lara for a [facility] 08/14/2021. She is a 21 y.o. female, who is being followed for allergic rhinoconjunctivitis and lip dermatitis. Her previous allergy office visit was on 01/28/2021 with Dr. Selena Batten. Today is a regular follow up visit. She is accompanied today by her mother who provided/contributed to the history.   Allergic rhino conjunctivitis Currently not taking any daily medications. Usually she has to take medications in the spring - zyrtec, Flonase and eye drops as needed.    Skin Patient has been having itchy skin and using Nivea cream.  Mainly right under her armpits.  Lip is doing much better.   Assessment and Plan: April Lara is a 21 y.o. female with: Seasonal and perennial allergic rhinoconjunctivitis Past history - Rhinoconjunctivitis symptoms mainly in the spring and fall.  Tried Zyrtec and Flonase with some benefit.  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 2022 skin testing showed: Positive to grass, weed pollen, ragweed, trees, dust mites, cat and borderline to dog.  Interim history - usually flares in the spring. Currently asymptomatic with no meds. Continue environmental control measures.  Use over the counter antihistamines such as Zyrtec (cetirizine), Claritin (loratadine), Allegra (fexofenadine), or Xyzal (levocetirizine) daily as needed. May take twice a day during  allergy flares. May switch antihistamines every few months. Use Flonase (fluticasone) nasal spray 1 spray per nostril twice a day as needed for nasal congestion.  Use over the counter refresh eye drops.  Consider allergy injections for long term control if above medications do not help the symptoms - handout given.  Pruritus Noted some whole body pruritus without any rash. Worse is right under the armpits. See below for proper skin care. May try to take zyrtec 10mg  daily at night to see if it helps with the itching.  Make sure to moisturize daily with a thick cream or ointment. Try Aquaphor, Eucerin, Vanicream ointment.  Try deodorant from Vanicream or Navient.   If not improved, will get bloodwork next.   Return in about 6 months (around 02/11/2022).  No orders of the defined types were placed in this encounter.  Lab Orders  No laboratory test(s) ordered today    Diagnostics: None.  Medication List:  Current Outpatient Medications  Medication Sig Dispense Refill   cetirizine (ZYRTEC) 10 MG tablet Take 1 tablet (10 mg total) by mouth daily. For allergies 30 tablet 11   fluticasone (FLONASE) 50 MCG/ACT nasal spray Place 1-2 sprays into both nostrils daily. 16 g 12   Norethindrone Acetate-Ethinyl Estradiol (JUNEL 1.5/30) 1.5-30 MG-MCG tablet Take 1 tablet by mouth daily. 30 tablet 12   triamcinolone ointment (KENALOG) 0.1 % Apply 1 application topically 2 (two) times daily. Apply over rough patches of skin, for no more than 14 days total 80 g 4   No current facility-administered medications for this visit.   Allergies: No Known Allergies I reviewed her past medical history, social  history, family history, and environmental history and no significant changes have been reported from her previous visit.  Review of Systems  Constitutional:  Negative for appetite change, chills, fever and unexpected weight change.  HENT:  Negative for congestion and rhinorrhea.   Eyes:  Negative for  itching.  Respiratory:  Negative for cough, chest tightness, shortness of breath and wheezing.   Cardiovascular:  Negative for chest pain.  Gastrointestinal:  Negative for abdominal pain.  Genitourinary:  Negative for difficulty urinating.  Skin:  Negative for rash.       pruritus  Allergic/Immunologic: Positive for environmental allergies.  Neurological:  Negative for headaches.   Objective: BP 110/68    Pulse 96    Temp 98.3 F (36.8 C)    Resp 20    Ht 5\' 1"  (1.549 m)    Wt 97 lb 6.4 oz (44.2 kg)    SpO2 98%    BMI 18.40 kg/m  Body mass index is 18.4 kg/m. Physical Exam Vitals and nursing note reviewed.  Constitutional:      Appearance: Normal appearance. She is well-developed.  HENT:     Head: Normocephalic and atraumatic.     Right Ear: External ear normal.     Left Ear: External ear normal.     Nose: Nose normal.     Mouth/Throat:     Mouth: Mucous membranes are moist.     Pharynx: Oropharynx is clear.  Eyes:     Conjunctiva/sclera: Conjunctivae normal.  Cardiovascular:     Rate and Rhythm: Normal rate and regular rhythm.     Heart sounds: Normal heart sounds. No murmur heard.   No friction rub. No gallop.  Pulmonary:     Effort: Pulmonary effort is normal.     Breath sounds: Normal breath sounds. No wheezing, rhonchi or rales.  Abdominal:     Palpations: Abdomen is soft.  Musculoskeletal:     Cervical back: Neck supple.  Skin:    General: Skin is warm.     Findings: No rash.  Neurological:     Mental Status: She is alert and oriented to person, place, and time.  Psychiatric:        Behavior: Behavior normal.   Previous notes and tests were reviewed. The plan was reviewed with the patient/family, and all questions/concerned were addressed.  It was my pleasure to see April Lara today and participate in her care. Please feel free to contact me with any questions or concerns.  Sincerely,  Shawna Orleans, DO Allergy & Immunology  [facility] office: [phone] [facility] office: [phone]

## 2021-08-14 NOTE — Patient Instructions (Addendum)
Environmental allergies 2022 skin testing showed: Positive to grass, weed pollen, ragweed, trees, dust mites, cat and borderline to dog.  Continue environmental control measures.  Use over the counter antihistamines such as Zyrtec (cetirizine), Claritin (loratadine), Allegra (fexofenadine), or Xyzal (levocetirizine) daily as needed. May take twice a day during allergy flares. May switch antihistamines every few months. Use Flonase (fluticasone) nasal spray 1 spray per nostril twice a day as needed for nasal congestion.  Use over the counter refresh eye drops.  Consider allergy injections for long term control if above medications do not help the symptoms - handout given.  Skin/itching: See below for proper skin care. May try to take zyrtec 10mg  daily at night to see if it helps with the itching.  Make sure to moisturize daily with a thick cream or ointment. Try Aquaphor, Eucerin, Vanicream ointment.  Try deodorant from Vanicream or Navient.    Follow up in 6 months or sooner if needed.   Skin care recommendations  Bath time: Always use lukewarm water. AVOID very hot or cold water. Keep bathing time to 5-10 minutes. Do NOT use bubble bath. Use a mild soap and use just enough to wash the dirty areas. Do NOT scrub skin vigorously.  After bathing, pat dry your skin with a towel. Do NOT rub or scrub the skin.  Moisturizers and prescriptions:  ALWAYS apply moisturizers immediately after bathing (within 3 minutes). This helps to lock-in moisture. Use the moisturizer several times a day over the whole body. Good summer moisturizers include: Aveeno, CeraVe, Cetaphil. Good winter moisturizers include: Aquaphor, Vaseline, Cerave, Cetaphil, Eucerin, Vanicream. When using moisturizers along with medications, the moisturizer should be applied about one hour after applying the medication to prevent diluting effect of the medication or moisturize around where you applied the medications. When not  using medications, the moisturizer can be continued twice daily as maintenance.  Laundry and clothing: Avoid laundry products with added color or perfumes. Use unscented hypo-allergenic laundry products such as Tide free, Cheer free & gentle, and All free and clear.  If the skin still seems dry or sensitive, you can try double-rinsing the clothes. Avoid tight or scratchy clothing such as wool. Do not use fabric softeners or dyer sheets.

## 2021-08-14 NOTE — Assessment & Plan Note (Signed)
Past history - Rhinoconjunctivitis symptoms mainly in the spring and fall.  Tried Zyrtec and Flonase with some benefit.  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 2022 skin testing showed: Positive to grass, weed pollen, ragweed, trees, dust mites, cat and borderline to dog.  Interim history - usually flares in the spring. Currently asymptomatic with no meds.  Continue environmental control measures.   Use over the counter antihistamines such as Zyrtec (cetirizine), Claritin (loratadine), Allegra (fexofenadine), or Xyzal (levocetirizine) daily as needed. May take twice a day during allergy flares. May switch antihistamines every few months.  Use Flonase (fluticasone) nasal spray 1 spray per nostril twice a day as needed for nasal congestion.   Use over the counter refresh eye drops.   Consider allergy injections for long term control if above medications do not help the symptoms - handout given.

## 2021-08-14 NOTE — Assessment & Plan Note (Addendum)
Noted some whole body pruritus without any rash. Worse is right under the armpits.  See below for proper skin care.  May try to take zyrtec 10mg  daily at night to see if it helps with the itching.   Make sure to moisturize daily with a thick cream or ointment. o Try Aquaphor, Eucerin, Vanicream ointment.   Try deodorant from Vanicream or Navient.    If not improved, will get bloodwork next.

## 2022-02-19 ENCOUNTER — Ambulatory Visit: Payer: Medicaid Other | Admitting: Allergy

## 2022-03-17 ENCOUNTER — Ambulatory Visit: Payer: Medicaid Other | Admitting: Allergy

## 2022-05-29 ENCOUNTER — Other Ambulatory Visit: Payer: Self-pay | Admitting: Pediatrics

## 2022-05-29 DIAGNOSIS — Z30011 Encounter for initial prescription of contraceptive pills: Secondary | ICD-10-CM

## 2022-06-03 ENCOUNTER — Telehealth: Payer: Self-pay

## 2022-06-03 DIAGNOSIS — Z09 Encounter for follow-up examination after completed treatment for conditions other than malignant neoplasm: Secondary | ICD-10-CM

## 2022-06-03 NOTE — Telephone Encounter (Signed)
SWCM mailed pt notification of Adolescent Transition. Pt has 30 days to schedule with adult primary care physician. Pt also sent letter via [place]. Pt can call SWCM if needing assistance scheduling with adult PCP.     Shaune Pollack, BSW, QP Social Work Case Programmer, multimedia and [employer] for Child and Adolescent Health Office: [phone] Direct Number: [phone]

## 2022-06-17 ENCOUNTER — Telehealth: Payer: Self-pay

## 2022-06-17 DIAGNOSIS — Z09 Encounter for follow-up examination after completed treatment for conditions other than malignant neoplasm: Secondary | ICD-10-CM

## 2022-06-17 NOTE — Telephone Encounter (Signed)
SWCM spoke to pt regarding need to transition to adult PCP. Pt is still looking into where she would like to go but did receive letter. Pt is currently uninsured, SWCM provided info for practices pt can go to that will support with financial assistance. Pt stated understanding and is to call [facility] is she needs further support.    Shaune Pollack, BSW, QP Social Work Case Programmer, multimedia and [employer] for Child and Adolescent Health Office: [phone] Direct Number: [phone]

## 2022-08-27 ENCOUNTER — Other Ambulatory Visit: Payer: Self-pay | Admitting: Pediatrics

## 2022-08-27 DIAGNOSIS — Z30011 Encounter for initial prescription of contraceptive pills: Secondary | ICD-10-CM

## 2022-09-04 ENCOUNTER — Encounter: Payer: Self-pay | Admitting: Family Medicine

## 2022-09-04 ENCOUNTER — Ambulatory Visit: Payer: Medicaid Other | Admitting: Family Medicine

## 2022-09-04 VITALS — BP 119/82 | HR 105 | Ht 61.5 in | Wt 99.0 lb

## 2022-09-04 DIAGNOSIS — R6882 Decreased libido: Secondary | ICD-10-CM | POA: Diagnosis not present

## 2022-09-04 DIAGNOSIS — Z23 Encounter for immunization: Secondary | ICD-10-CM

## 2022-09-04 DIAGNOSIS — R5383 Other fatigue: Secondary | ICD-10-CM | POA: Diagnosis not present

## 2022-09-04 DIAGNOSIS — Z1159 Encounter for screening for other viral diseases: Secondary | ICD-10-CM

## 2022-09-04 DIAGNOSIS — R7989 Other specified abnormal findings of blood chemistry: Secondary | ICD-10-CM

## 2022-09-04 NOTE — Patient Instructions (Addendum)
It was wonderful to see you today.  Today we talked about:  Fatigue - I am going to check some labs. I will let you know the results   Please follow up in 2 months   Thank you for choosing [facility].   Please call [phone] with any questions about today's appointment.  Please be sure to schedule follow up at the front desk before you leave today.   Lowry Ram, MD  Family Medicine     Therapy and Counseling Resources Most providers on this list will take Medicaid. Patients with commercial insurance or Medicare should contact their insurance company to get a list of in network providers.  [employer] (takes children) Location 1: [address] [place], [place] [address] Location 2: [place], [place] [address] [place] ([place] speaking therapist available)(habla espanol)(take medicare and medicaid)  [place], [place], [place] [address], [place] [email or web address] [phone]  [facility]:Psychiatry and Counseling [address] [place], [place] [address] [place], [place], [place] [address]      [phone]  [place] (spanish available) [place], [place] [address] [place] (take [facility] and medicare) [address] [place], [place] [address]       [phone]     [place] (virtual only) [phone]  [facility]inny Bl[facility] [address] [place] [address] [place], [place]  Family Solutions:  [place]. [place] [phone]  [facility]:  [address] [place] [phone]  [facility] (under & uninsured) [address] [place] [phone]    [email or web address]    [place] [address]  [place]     [phone]  [facility] [place]     Phone:  [phone]     [facility]  [place] [address]      [place], [place] ext [place]: [place], [place], [place]   [place] ([place] therapist) [email or web address]  [place] [address]   [place] [address]    [phone]    [facility]   [address],  [place], [place]   [place]  [place]  [facility]  [address] [place], [place]        [phone]  Open Access/Walk In Clinic under & uninsured  [facility]  [address] [place], [place] Crisis [phone]  [facility] of the [place],  ([place])   [place], [place]: [phone]) 8:30 - 12; 1 - 2:30  Family Service of the [place],  [place], [place]    [phone]):8:30 - 12; 2 - 3PM  [facility] [employer],  [address]  [place]; [phone]):   Mon - Fri 8 AM - 5 PM  [facility] [place]  MWF 12:30 to 3:00 or call to schedule an appointment  [phone]  Specific Provider options Psychology Today  [email or web address] click on find a therapist  enter your zip code  left side and select or tailor a therapist for your specific need.   [facility] Provider Directory [email or web address]  (Medicaid)   Follow all drop down to find a provider  [place] or [email or web address] [address], [place], [place] Recovery support and educational   24- Hour Availability:   [facility]  [address] [place], [place] Crisis [phone]  Family Service of the [employer] [phone]  [place]  [phone]   [place]  [phone] (after hours)  Therapeutic Alternative/Mobile Crisis   [phone]  [place] National Suicide Hotline  3324802457 [place])  Call 911 or go to emergency room  [facility]  [phone]);  [place] and [employer]  [phone]); [place], [place], [place], [place], [place], [place], [place]

## 2022-09-04 NOTE — Progress Notes (Signed)
  HPI:  Patient presents today for a new patient appointment to establish general primary care.  Prior PCP: [facility], last seen 2 years ago   Other care team members: No other doctors   Concerns today: new mole beneath L breast, Patient states that she has had decreased libido in the last two years. She states that she has been more stressed. She states that she is still able to get aroused but takes a little bit longer now. States that after she has intercourse she feels like she does not want to have intercourse for two days. Patient says that she also feels fatigued and gets cold easily. Denies heavy periods. Denies eating less recently. Patient does say that she sleeps at 4 am and wakes up at 9 am or 10 am. She is not bothered by her decreased libido, but it bothers her partner.   Past Medical Hx:  - Irregular menses: Normalized after taking BC - Scoliosis: Last xray in 2016  - Seasonal Allergies: uses flonase and zyrtec as needed.   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, Film/video editor  - lives with: Boyfriend and 2 cats  - tobacco: Vapes 1 cartridge lasts 2 months  - alcohol: one beer once every 5 months  - drugs: no other substances   Health Maintenance:  - Flu vaccine administered today   PHYSICAL EXAM: BP 119/82   Pulse (!) 105   Ht 5' 1.5" (1.562 m)   Wt 99 lb (44.9 kg)   LMP 09/02/2022   SpO2 100%   BMI 18.40 kg/m  General: well appearing, in no acute distress CV: RRR, radial pulses equal and palpable, no BLE edema  Resp: Normal work of breathing on room air, CTAB Abd: Soft, non tender, non distended  Neuro: Alert & Oriented x 4  Derm: Small 0.5 cm mole with regular border, consistent shade.   Other fatigue -     TSH Rfx on Abnormal to Free T4  Encounter for hepatitis C screening test for low risk patient -      Hepatitis C antibody  Need for immunization against influenza -     Flu Vaccine QUAD 32mo+IM (Fluarix, Fluzone & Alfiuria Quad PF)  Low libido Assessment & Plan: Decreased libido does not seem to pathologic. Seems to be normal physiologic variant. Patient is not on any medication that would decrease libido. However, due to other symptoms of fatigue and cold intolerance could be a symptom of hypothyroidism  - Counseled regarding normal ebbs and flows of libido  - TSH      Lowry Ram, MD [place]

## 2022-09-05 LAB — TSH RFX ON ABNORMAL TO FREE T4: TSH: 0.61 u[IU]/mL (ref 0.450–4.500)

## 2022-09-05 LAB — HEPATITIS C ANTIBODY: Hep C Virus Ab: NONREACTIVE

## 2022-09-06 DIAGNOSIS — R6882 Decreased libido: Secondary | ICD-10-CM | POA: Insufficient documentation

## 2022-09-06 NOTE — Assessment & Plan Note (Addendum)
Decreased libido does not seem to pathologic. Seems to be normal physiologic variant. Patient is not on any medication that would decrease libido. However, due to other symptoms of fatigue and cold intolerance could be a symptom of hypothyroidism  - Counseled regarding normal ebbs and flows of libido  - TSH

## 2022-10-02 ENCOUNTER — Other Ambulatory Visit: Payer: Self-pay | Admitting: Pediatrics

## 2022-10-02 ENCOUNTER — Other Ambulatory Visit: Payer: Self-pay

## 2022-10-02 DIAGNOSIS — Z30011 Encounter for initial prescription of contraceptive pills: Secondary | ICD-10-CM

## 2022-10-03 MED ORDER — NORETHINDRONE ACET-ETHINYL EST 1.5-30 MG-MCG PO TABS: 1.0000 | ORAL_TABLET | Freq: Every day | ORAL | 3 refills | Status: DC

## 2022-11-06 ENCOUNTER — Emergency Department (HOSPITAL_BASED_OUTPATIENT_CLINIC_OR_DEPARTMENT_OTHER): Payer: Medicaid Other | Admitting: Radiology

## 2022-11-06 ENCOUNTER — Other Ambulatory Visit: Payer: Self-pay

## 2022-11-06 ENCOUNTER — Emergency Department (HOSPITAL_BASED_OUTPATIENT_CLINIC_OR_DEPARTMENT_OTHER)
Admission: EM | Admit: 2022-11-06 | Discharge: 2022-11-06 | Disposition: A | Discharge status: Home / Self Care | Payer: Medicaid Other | Attending: Emergency Medicine | Admitting: Emergency Medicine

## 2022-11-06 DIAGNOSIS — R0789 Other chest pain: Secondary | ICD-10-CM | POA: Diagnosis present

## 2022-11-06 DIAGNOSIS — R202 Paresthesia of skin: Secondary | ICD-10-CM | POA: Diagnosis not present

## 2022-11-06 DIAGNOSIS — R000 Tachycardia, unspecified: Secondary | ICD-10-CM

## 2022-11-06 DIAGNOSIS — F419 Anxiety disorder, unspecified: Secondary | ICD-10-CM | POA: Diagnosis not present

## 2022-11-06 DIAGNOSIS — R079 Chest pain, unspecified: Secondary | ICD-10-CM | POA: Diagnosis not present

## 2022-11-06 LAB — CBC WITH DIFFERENTIAL/PLATELET
Abs Immature Granulocytes: 0.02 10*3/uL (ref 0.00–0.07)
Basophils Absolute: 0.1 10*3/uL (ref 0.0–0.1)
Basophils Relative: 1 %
Eosinophils Absolute: 0.2 10*3/uL (ref 0.0–0.5)
Eosinophils Relative: 2 %
HCT: 39.9 % (ref 36.0–46.0)
Hemoglobin: 13.6 g/dL (ref 12.0–15.0)
Immature Granulocytes: 0 %
Lymphocytes Relative: 24 %
Lymphs Abs: 1.8 10*3/uL (ref 0.7–4.0)
MCH: 30.3 pg (ref 26.0–34.0)
MCHC: 34.1 g/dL (ref 30.0–36.0)
MCV: 88.9 fL (ref 80.0–100.0)
Monocytes Absolute: 0.4 10*3/uL (ref 0.1–1.0)
Monocytes Relative: 6 %
Neutro Abs: 5.1 10*3/uL (ref 1.7–7.7)
Neutrophils Relative %: 67 %
Platelets: 297 10*3/uL (ref 150–400)
RBC: 4.49 MIL/uL (ref 3.87–5.11)
RDW: 12.9 % (ref 11.5–15.5)
WBC: 7.6 10*3/uL (ref 4.0–10.5)
nRBC: 0 % (ref 0.0–0.2)

## 2022-11-06 LAB — BASIC METABOLIC PANEL
Anion gap: 11 (ref 5–15)
BUN: 10 mg/dL (ref 6–20)
CO2: 20 mmol/L — ABNORMAL LOW (ref 22–32)
Calcium: 9.4 mg/dL (ref 8.9–10.3)
Chloride: 107 mmol/L (ref 98–111)
Creatinine, Ser: 0.67 mg/dL (ref 0.44–1.00)
GFR, Estimated: >60 mL/min (ref >60)
Glucose, Bld: 84 mg/dL (ref 70–99)
Potassium: 3.6 mmol/L (ref 3.5–5.1)
Sodium: 138 mmol/L (ref 135–145)

## 2022-11-06 LAB — HCG, SERUM, QUALITATIVE: Preg, Serum: NEGATIVE

## 2022-11-06 LAB — D-DIMER, QUANTITATIVE: D-Dimer, Quant: 0.43 ug/mL-FEU (ref 0.00–0.50)

## 2022-11-06 LAB — TROPONIN I (HIGH SENSITIVITY): Troponin I (High Sensitivity): <2 ng/L (ref <18)

## 2022-11-06 MED ORDER — HYDROXYZINE HCL 25 MG PO TABS: 25.0000 mg | ORAL_TABLET | Freq: Three times a day (TID) | ORAL | 0 refills | Status: AC | PRN

## 2022-11-06 NOTE — Discharge Instructions (Signed)
Thank you for letting us take care of you today.  Your workup was reassuring.  Your lab work including testing to assess probability for blood clots as well as problems with your heart were both negative.  I suspect that your symptoms are related to anxiety.  I prescribed a medication for you to try at home to see if this will help with your symptoms.  Please take this only as prescribed.  I think it is important that you establish care with a primary care provider for reassessment.  I provided information for 2 clinics that you may contact to schedule a follow-up appointment or you may schedule an appointment with a PCP of your own choosing.  Please try to make this appointment within the next week.  With your heart rate continuing to run high, they may want to do more testing or have you seen by heart doctor or cardiologist.  For any new or worsening symptoms such as severe chest pain, shortness of breath, lightheadedness, dizziness, loss of consciousness, weakness on one side of your body or another, or other new, concerning symptoms, please return to the nearest emergency department for reevaluation.

## 2022-11-06 NOTE — ED Triage Notes (Signed)
Arrives POV to ED. Ambulatory. A+O x4. NAD.  Sitting in park crying-"having a moment". Left arm experienced paresthesia. Began driving and experienced same in both hands-EMS came out for eval and offered transport- opted to have friend drive here. Now experiencing left CP and anxiety.   No HX. Only med oral contraceptive.

## 2022-11-06 NOTE — ED Provider Notes (Signed)
[facility] Provider Note   CSN: [id number] Arrival date & time: 11/06/22  1333     History  Chief Complaint  Patient presents with   Chest Pain    April Lara is a 22 y.o. female with past medical history eczema who presents to the ED stating that she was driving today when she felt a heaviness and paresthesias in all of her limbs as well as tightness in her chest.  Patient reports that she has been having intermittent chest tightness for at least the last week.  She describes pain as on the left side of her chest and is intermittent.  No radiating pain.  No diaphoresis, nausea, or vomiting.  No dizziness, lightheadedness, syncope, shortness of breath, fever, chills, cough, congestion, abdominal pain or swelling, change in PO intake, or other complaints.  She reports that she takes oral contraceptives and has for many years and has always been concerned that she may be developing a blood clot.  No recent surgery, no recent travel, no history of DVT/PE.  No family history of heart disease or arrhythmias.  She does note that her watch has been notifying her over the last few days that she has been tachycardic up to the 130s.  She denies any history of tachycardia previous to this.      Home Medications Prior to Admission medications   Medication Sig Start Date End Date Taking? Authorizing Provider  hydrOXYzine (ATARAX) 25 MG tablet Take 1 tablet (25 mg total) by mouth every 8 (eight) hours as needed for up to 5 days for anxiety. 11/06/22 11/11/22 Yes Damieon Armendariz L, PA-C  cetirizine (ZYRTEC) 10 MG tablet Take 1 tablet (10 mg total) by mouth daily. For allergies 06/04/21   Ettefagh, Paul Dykes, MD  fluticasone [facility]) 50 MCG/ACT nasal spray Place 1-2 sprays into both nostrils daily. 06/04/21   Ettefagh, Paul Dykes, MD  Norethindrone Acetate-Ethinyl Estradiol (JUNEL 1.5/30) 1.5-30 MG-MCG tablet Take 1 tablet by mouth daily. 10/03/22    Lowry Ram, MD      Allergies    Patient has no known allergies.    Review of Systems   Review of Systems  All other systems reviewed and are negative.   Physical Exam Updated Vital Signs BP 120/81   Pulse 99   Resp 13   Ht 5\' 2"  (1.575 m)   Wt 42.6 kg   SpO2 100%   BMI 17.19 kg/m  Physical Exam Vitals and nursing note reviewed.  Constitutional:      General: She is not in acute distress.    Appearance: Normal appearance. She is not ill-appearing, toxic-appearing or diaphoretic.  HENT:     Head: Normocephalic and atraumatic.     Mouth/Throat:     Mouth: Mucous membranes are moist.  Eyes:     Extraocular Movements: Extraocular movements intact.     Conjunctiva/sclera: Conjunctivae normal.     Pupils: Pupils are equal, round, and reactive to light.  Cardiovascular:     Rate and Rhythm: Regular rhythm. Tachycardia present.     Heart sounds: Normal heart sounds. No murmur heard.    No S3 or S4 sounds.  Pulmonary:     Effort: Pulmonary effort is normal. No tachypnea or respiratory distress.     Breath sounds: Normal breath sounds. No stridor. No decreased breath sounds, wheezing, rhonchi or rales.  Abdominal:     General: Abdomen is flat.     Palpations: Abdomen is soft. There is  no mass.     Tenderness: There is no abdominal tenderness. There is no guarding or rebound.  Musculoskeletal:        General: Normal range of motion.     Cervical back: Normal range of motion and neck supple.     Right lower leg: No tenderness. No edema.     Left lower leg: No tenderness. No edema.  Skin:    General: Skin is warm and dry.     Capillary Refill: Capillary refill takes less than 2 seconds.  Neurological:     General: No focal deficit present.     Mental Status: She is alert and oriented to person, place, and time. Mental status is at baseline.     Cranial Nerves: No cranial nerve deficit.     Motor: No weakness.  Psychiatric:        Attention and Perception: Attention  normal.        Mood and Affect: Mood is anxious.        Speech: Speech normal.        Behavior: Behavior is cooperative.        Thought Content: Thought content normal.        Judgment: Judgment normal.     ED Results / Procedures / Treatments   Labs (all labs ordered are listed, but only abnormal results are displayed) Labs Reviewed  BASIC METABOLIC PANEL - Abnormal; Notable for the following components:      Result Value   CO2 20 (*)    All other components within normal limits  CBC WITH DIFFERENTIAL/PLATELET  D-DIMER, QUANTITATIVE  HCG, SERUM, QUALITATIVE  TROPONIN I (HIGH SENSITIVITY)    EKG EKG Interpretation  Date/Time:  Thursday November 06 2022 13:43:56 EDT Ventricular Rate:  111 PR Interval:  136 QRS Duration: 74 QT Interval:  308 QTC Calculation: 419 R Axis:   67 Text Interpretation: Sinus tachycardia RSR' in V1 or V2, probably normal variant Confirmed by Pattricia Boss [phone]) on 11/06/2022 1:55:46 PM  Radiology DG Chest 2 View  Result Date: 11/06/2022 CLINICAL DATA:  Chest pain. EXAM: CHEST - 2 VIEW COMPARISON:  03/29/2021 FINDINGS: The cardiac silhouette, mediastinal and hilar contours are normal. The lungs are clear. No pleural effusion or pneumothorax. The bony thorax is intact. IMPRESSION: No acute cardiopulmonary findings. Electronically Signed   By: Marijo Sanes M.D.   On: 11/06/2022 14:59    Procedures Procedures    Medications Ordered in ED Medications - No data to display  ED Course/ Medical Decision Making/ A&P                             Medical Decision Making Amount and/or Complexity of Data Reviewed Labs: ordered. Decision-making details documented in ED Course. Radiology: ordered. Decision-making details documented in ED Course. ECG/medicine tests: ordered. Decision-making details documented in ED Course.  Risk Prescription drug management.   Medical Decision Making:   April Lara is a 22 y.o. female who presented to  the ED today with multiple complaints detailed above.    Patient placed on continuous vitals and telemetry monitoring while in ED which was reviewed periodically.  Complete initial physical exam performed, notably the patient  was in no acute distress.  She was tachycardic but with regular rhythm.  Lungs clear to auscultation.  Alert and oriented.  Nonfocal neurologic exam.  No calf tenderness.  No lower extremity edema.   She did appear anxious but was  cooperative, answering questions appropriately, not expressing suicidal or homicidal ideation or agitation. Reviewed and confirmed nursing documentation for past medical history, family history, social history.    Initial Assessment:   With the patient's presentation, most likely diagnosis is anxiety. Differential diagnosis includes but is not limited to ACS, arrhythmia, electrolyte disturbance, AKI, DVT/PE, psychiatric emergency. This is most consistent with an acute complicated illness  Initial Plan:  Screening labs including CBC and Metabolic panel to evaluate for infectious or metabolic etiology of disease.  D-dimer to assess probability of DVT/PE CXR to evaluate for structural/infectious intrathoracic pathology.  EKG and troponin to evaluate for cardiac pathology Offered medications to help with anxiety, patient declines this stating she did not want to risk the side effects Objective evaluation as reviewed   Initial Study Results:   Laboratory  All laboratory results reviewed without evidence of clinically relevant pathology.    EKG EKG was reviewed independently. ST segments without concerns for elevations.   EKG: sinus tachycardia.   Radiology:  All images reviewed independently. Agree with radiology report at this time.   DG Chest 2 View  Result Date: 11/06/2022 CLINICAL DATA:  Chest pain. EXAM: CHEST - 2 VIEW COMPARISON:  03/29/2021 FINDINGS: The cardiac silhouette, mediastinal and hilar contours are normal. The lungs are clear.  No pleural effusion or pneumothorax. The bony thorax is intact. IMPRESSION: No acute cardiopulmonary findings. Electronically Signed   By: Marijo Sanes M.D.   On: 11/06/2022 14:59    Final Assessment and Plan:   This is a 22 year old female who presents to the ED for evaluation of multiple complaints as above.  Most notably, patient states that she was driving when she suddenly had the onset of heaviness and paresthesias in all of her extremities.  She did also note chest tightness but this has been intermittent for at least the last week.  Patient is tachycardic but with regular rhythm.  Lungs clear to auscultation.  She expresses concern for blood clots as she is currently on oral contraceptives.  No other identifiable risk factors for blood clots.  No personal or family history of arrhythmia or ACS.  Chest pain does not sound consistent with ACS.  EKG shows a sinus tachycardia but no acute ST-T changes.  Patient does appear significantly anxious on exam and symptoms as she describes do seem consistent with a panic attack but as she has not previously had evaluation workup obtained as above for further assessment.  Essentially unremarkable workup including CBC, metabolic, D-dimer, chest x-ray.  Patient rechecked and updated on all findings.  She expressed significant relief with normal workup.  Discussed with patient that I recommend that she follow-up closely with primary care especially as she does have persistent tachycardia.  She is currently asymptomatic so I do believe that it is reasonable that she follow-up in the outpatient setting.  She states that she has a follow-up appointment arranged for next week and will discuss today's ED visit at that appointment.  Patient aware that if she continues to have tachycardia she may need further evaluation by cardiology.  Patient expressed understanding of this.  Strict ED return precautions given, all questions answered, and stable for discharge.   Clinical  Impression:  1. Anxiety   2. Tachycardia   3. Paresthesias      Discharge           Final Clinical Impression(s) / ED Diagnoses Final diagnoses:  Anxiety  Tachycardia  Paresthesias    Rx / DC Orders ED  Discharge Orders          Ordered    hydrOXYzine (ATARAX) 25 MG tablet  Every 8 hours PRN        11/06/22 1615              Turner Daniels 11/06/22 1850    Pattricia Boss, MD 11/10/22 1056

## 2022-11-13 ENCOUNTER — Other Ambulatory Visit: Payer: Self-pay

## 2022-11-13 ENCOUNTER — Encounter: Payer: Self-pay | Admitting: Family Medicine

## 2022-11-13 ENCOUNTER — Ambulatory Visit: Payer: Medicaid Other | Admitting: Family Medicine

## 2022-11-13 VITALS — BP 126/88 | HR 62 | Ht 62.0 in | Wt 100.0 lb

## 2022-11-13 DIAGNOSIS — F419 Anxiety disorder, unspecified: Secondary | ICD-10-CM

## 2022-11-13 DIAGNOSIS — F39 Unspecified mood [affective] disorder: Secondary | ICD-10-CM

## 2022-11-13 MED ORDER — ESCITALOPRAM OXALATE 5 MG PO TABS: 5.0000 mg | ORAL_TABLET | Freq: Every day | ORAL | 0 refills | Status: DC

## 2022-11-13 NOTE — Progress Notes (Signed)
    SUBJECTIVE:   CHIEF COMPLAINT / HPI:   Anxiety  Patient states that she has been very anxious recently.  She feels like her heart is racing.  She was recently in the ED due to anxiety attack.  She felt like her upper extremities went numb and she felt like she could not follow-up with.  She had broken up with her long-term boyfriend the day prior.  She has had anxiety symptoms in the past reports she feels like they are worse now.  She has had thoughts about hurting herself but has no plans at this time.  She also feels like she cannot get sleep and feels like her thoughts are always running.  Says that she was on Zoloft about 5 years ago and had increased suicidal thoughts at that time.  Had tried it for 2 years.  PERTINENT  PMH / PSH: Mood disorder  OBJECTIVE:   BP 126/88   Pulse 62   Ht 5\' 2"  (1.575 m)   Wt 100 lb (45.4 kg)   SpO2 97%   BMI 18.29 kg/m   General: thin, in no acute distress  CV: RRR, radial pulses equal and palpable Resp: Normal work of breathing on room air, CTAB Abd: Soft, non tender, non distended  Neuro: Alert & Oriented  Psyc: tearful, has insight, organized throat process  GAD-7: 20 PHQ-9: 18, question 9 1 ASSESSMENT/PLAN:   Anxiety Anxiety definitely not controlled at this time.  Causing patient much distress and affecting her functionality.  Though patient has tried SSRI in the past this was during teenage years before development.  He is amenable to trying another SSRI.  Can contract for safety at this time.  Unlikely due to other pathophysiology, TSH checked at last visit was within normal limits. - Start Lexapro 5 mg - Follow-up in 2 weeks - Offered therapist covered by Medicaid - Ambulatory referral for care management for help finding a therapist     Lockie Mola, MD [facility]

## 2022-11-13 NOTE — Patient Instructions (Signed)
It was wonderful to see you today.  Please bring ALL of your medications with you to every visit.   Today we talked about:  Anxiety - I am sending a medication called lexapro. I sent you the starting dose. Please let me know if you have any side effects. I also sent a note to a Education officer, museum that you are given through insurance to help coordinate you with a therapist. They will just make it easier for you to get connected with one. Please just take the atarax/hydroxyzine if you need it.   Please follow up in 1 month   Thank you for choosing [facility].   Please call [phone] with any questions about today's appointment.  Please be sure to schedule follow up at the front desk before you leave today.   Lowry Ram, MD  Family Medicine    Therapy and Counseling Resources Most providers on this list will take Medicaid. Patients with commercial insurance or Medicare should contact their insurance company to get a list of in network providers.  [employer] (takes children) Location 1: [address] [place], [place] [address] Location 2: [place], [place] [address] [place] ([place] speaking therapist available)(habla espanol)(take medicare and medicaid)  [place], [place], [place] [address], [place] [email or web address] [phone]  [facility]:Psychiatry and Counseling [address] [place], [place] [address] [place], [place], [place] [address]      [phone]  [place] (spanish available) [place], [place] [address] [facility] (take [facility] and medicare) [address], [place], [place] [address]       [phone]     [place] (virtual only) [phone]  [facility] [address] [place], [facility] [place]  [facility]:  [place]. [place] [phone]  [facility]:  [place] [address] [place] [phone]  [facility] (under & uninsured) [address] [place] [phone]    [email or web address]    [facility] [place] [address]  [place]    [phone]  [facility] [place]     Phone:  [phone]     [place]  [place] [address]      [place], [place] [address] [place]: [place], [place], [place]   [place] ([place] therapist) [email or web address]  [place] [address]   [place] [address]    [phone]    [facility]   [address] [place],  [place], [place]   [place]  [place]  [facility]  [address] [place], [place]        [phone]  Open Access/Walk In Clinic under & uninsured  [facility]  [address] [place], [place] [phone]  [facility] [place],  ([place])   [place], [place]: [phone]) 8:30 - 12; 1 - 2:30  [facility] of the [place],  [place], [place]    [phone]):8:30 - 12; 2 - 3PM  [facility],  [address]  [place]; [phone]):   Mon - Fri 8 AM - 5 PM  [facility] [place]  MWF 12:30 to 3:00 or call to schedule an appointment  [phone]  Specific Provider options Psychology Today  [email or web address] click on find a therapist  enter your zip code left side and select or tailor a therapist for your specific need.   [facility] Provider  Directory [email or web address]  (Medicaid)   Follow all drop down to find a provider  [place] or [email or web address] [address], [place], [place] Recovery support and educational   24- Hour Availability:   [facility]  [address] [place], [place] Crisis [phone]  Family Service of the [employer] [phone]  [place]  [phone]   [place]  [phone] (after hours)  Therapeutic Alternative/Mobile Crisis   [phone]  [place] National Suicide Hotline  361-875-3910 Diamantina Monks)  Call 911 or go to emergency room  [facility]  [phone]);  [place] and [employer]  [phone]); [place], [place], [place], [place], [place], [place], [place]

## 2022-11-14 ENCOUNTER — Encounter: Payer: Self-pay | Admitting: Family Medicine

## 2022-11-14 NOTE — Assessment & Plan Note (Signed)
Anxiety definitely not controlled at this time.  Causing patient much distress and affecting her functionality.  Though patient has tried SSRI in the past this was during teenage years before development.  He is amenable to trying another SSRI.  Can contract for safety at this time.  Unlikely due to other pathophysiology, TSH checked at last visit was within normal limits. - Start Lexapro 5 mg - Follow-up in 2 weeks - Offered therapist covered by [facility] - Ambulatory referral for care management for help finding a therapist

## 2022-11-24 ENCOUNTER — Other Ambulatory Visit: Payer: Medicaid Other | Admitting: Licensed Clinical Social Worker

## 2022-11-24 NOTE — Patient Instructions (Addendum)
Visit Information  Ms. April Lara was given information about Medicaid Managed Care team care coordination services as a part of their Healthy [employer] Medicaid benefit. April Lara verbally consented to engagement with the [facility] Managed Care team.   If you are experiencing a medical emergency, please call 911 or report to your local emergency department or urgent care.   If you have a non-emergency medical problem during routine business hours, please contact your provider's office and ask to speak with a nurse.   For questions related to your Healthy [facility] health plan, please call: [phone] or visit the homepage here: [email or web address]  If you would like to schedule transportation through your Healthy [facility] plan, please call the following number at least 2 days in advance of your appointment: [phone]  For information about your ride after you set it up, call Ride Assist at [phone]. Use this number to activate a Will Call pickup, or if your transportation is late for a scheduled pickup. Use this number, too, if you need to make a change or cancel a previously scheduled reservation.  If you need transportation services right away, call [phone]. The after-hours call center is staffed 24 hours to handle ride assistance and urgent reservation requests (including discharges) 365 days a year. Urgent trips include sick visits, hospital discharge requests and life-sustaining treatment.  Call the [facility] Line at [phone], at any time, 24 hours a day, 7 days a week. If you are in danger or need immediate medical attention call 911.  If you would like help to quit smoking, call 1-800-QUIT-NOW ((256)019-7816) OR Espaol: 1-855-Djelo-Ya (2-202-542-7062) o para ms informacin haga clic aqu or Text READY to 376-283 to register via text  Following is a copy of your plan of care:  Care Plan :  LCSW Plan of Care  Updates made by Gustavus Bryant, LCSW since 11/24/2022 12:00 AM     Problem: Anxiety Identification (Anxiety)      Goal: Anxiety Symptoms Identified   Start Date: 11/24/2022  Note:   Priority: High  Timeframe:  Short Range Goal Priority:  High Start Date:   11/24/22       Expected End Date:  ongoing                     Follow Up Date--12/02/22 at 11 am  - keep 90 percent of scheduled appointments -consider counseling or psychiatry -consider bumping up your self-care  -consider creating a stronger support network   Why is this important?             Combatting depression may take some time.            If you don't feel better right away, don't give up on your treatment plan.    Current barriers:   Chronic Mental Health needs related to depression, stress and anxiety. Patient requires Support, Education, Resources, Referrals, Advocacy, and Care Coordination, in order to meet Unmet Mental Health Needs. Patient will implement clinical interventions discussed today to decrease symptoms of depression and increase knowledge and/or ability of: coping skills. Mental Health Concerns and Social Isolation Patient lacks knowledge of available community counseling agencies and resources.  Patient Goals/Self-Care Activities: Over the next 120 days Attend scheduled medical appointments Utilize healthy coping skills and supportive resources discussed Contact PCP with any questions or concerns Keep 90 percent of counseling appointments Call your insurance provider for more information about your Enhanced Benefits  Check out counseling resources provided  Begin personal counseling with LCSW, to reduce and manage symptoms of Depression and Stress, until well-established with mental health provider Accept all calls from representative with [facility] in an effort to establish ongoing mental health counseling and supportive services. Incorporate into  daily practice - relaxation techniques, deep breathing exercises, and mindfulness meditation strategies. Talk about feelings with friends, family members, spiritual advisor, etc. Contact LCSW directly [phone]), if you have questions, need assistance, or if additional social work needs are identified between now and our next scheduled telephone outreach call. Call [phone] for mental health hotline/crisis line if needed (24/7 available) Try techniques to reduce symptoms of anxiety/negative thinking (deep breathing, distraction, positive self talk, etc)  - develop a personal safety plan - develop a plan to deal with triggers like holidays, anniversaries - exercise at least 2 to 3 times per week - have a plan for how to handle bad days - journal feelings and what helps to feel better or worse - spend time or talk with others at least 2 to 3 times per week - watch for early signs of feeling worse - begin personal counseling - call and visit an old friend - check out volunteer opportunities - join a support group - laugh; watch a funny movie or comedian - learn and use visualization or guided imagery - perform a random act of kindness - practice relaxation or meditation daily - start or continue a personal journal - practice positive thinking and self-talk -continue with compliance of taking medication  -identify current effective and ineffective coping strategies.  -implement positive self-talk in care to increase self-esteem, confidence and feelings of control.  -consider alternative and complementary therapy approaches such as meditation, mindfulness or yoga.  -journaling, prayer, worship services, meditation or pastoral counseling.  -increase participation in pleasurable group activities such as hobbies, singing, sports or volunteering).  -consider the use of meditative movement therapy such as tai chi, yoga or qigong.  -start a regular daily exercise program based on tolerance, ability  and patient choice to support positive thinking and activity    If you are experiencing a Mental Health or Behavioral Health Crisis or need someone to talk to, please call the Suicide and Crisis Lifeline: 988    Patient Goals: Initial goal

## 2022-11-24 NOTE — Patient Outreach (Signed)
Medicaid Managed Care Social Work Note  11/24/2022 Name:  April Lara MRN:  [id number] DOB:  September 10, 2000  April Lara is an 22 y.o. year old female who is a primary patient of April Mola, MD.  The Medicaid Managed Care Coordination team was consulted for assistance with:  Mental Health Counseling and Resources  Ms. April Lara was given information about Medicaid Managed Care Coordination team services today. April Lara Patient agreed to services and verbal consent obtained.  Engaged with patient  for by telephone forinitial visit in response to referral for case management and/or care coordination services.   Assessments/Interventions:  Review of past medical history, allergies, medications, health status, including review of consultants reports, laboratory and other test data, was performed as part of comprehensive evaluation and provision of chronic care management services.  SDOH: (Social Determinant of Health) assessments and interventions performed: SDOH Interventions    Flowsheet Row Patient Outreach Telephone from 11/24/2022 in [place] [facility]ULATION HEALTH DEPARTMENT Office Visit from 11/13/2022 in [facility] Office Visit from 11/25/2016 in [place] [facility]  SDOH Interventions     Depression Interventions/Treatment  Medication, Counseling Medication, Counseling Medication, Counseling  Stress Interventions Offered [employer], Provide Counseling -- --       Advanced Directives Status:  See Care Plan for related entries.  Care Plan                 No Known Allergies  Medications Reviewed Today     Reviewed by April Lara, CMA (Certified Medical Assistant) on 11/13/22 at 1352  Med List Status: <None>   Medication Order Taking? Sig Documenting Provider Last Dose Status Informant  cetirizine (ZYRTEC) 10 MG tablet [id number] No  Take 1 tablet (10 mg total) by mouth daily. For allergies Lara, April Baba, MD Taking Active   fluticasone [facility]) 50 MCG/ACT nasal spray [id number] No Place 1-2 sprays into both nostrils daily. Lara, April Baba, MD Taking Active   Norethindrone Acetate-Ethinyl Estradiol (JUNEL 1.5/30) 1.5-30 MG-MCG tablet [id number]  Take 1 tablet by mouth daily. 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   Seasonal and perennial allergic rhinoconjunctivitis 03/30/2015    Conditions to be addressed/monitored per PCP order:  Anxiety  Care Plan : April Lara Plan of Care  Updates made by April Bryant, April Lara since 11/24/2022 12:00 AM     Problem: Anxiety Identification (Anxiety)      Goal: Anxiety Symptoms Identified   Start Date: 11/24/2022  Note:   Priority: High  Timeframe:  Short Range Goal Priority:  High Start Date:   11/24/22       Expected End Date:  ongoing                     Follow Up Date--12/02/22 at 11 am  - keep 90 percent of scheduled appointments -consider counseling or psychiatry -consider bumping up your self-care  -consider creating a stronger support network   Why is this important?             Combatting depression may take some time.  If you don't feel better right away, don't give up on your treatment plan.    Current barriers:   Chronic Mental Health needs related to depression, stress and anxiety. Patient requires Support, Education, Resources, Referrals, Advocacy, and Care Coordination, in order to meet Unmet Mental Health Needs. Patient will implement clinical interventions discussed today to decrease symptoms of depression and increase knowledge and/or ability of: coping  skills. Mental Health Concerns and Social Isolation Patient lacks knowledge of available community counseling agencies and resources.  Clinical Goal(s): verbalize understanding of plan for management of Anxiety, Depression, and Stress and demonstrate a reduction in symptoms. Patient will connect with a provider for ongoing mental health treatment, increase coping skills, healthy habits, self-management skills, and stress reduction        Clinical Interventions:  Assessed patient's previous and current treatment, coping skills, support system and barriers to care. Patient provided hx  Verbalization of feelings encouraged, motivational interviewing employed Emotional support provided, positive coping strategies explored. Establishing healthy boundaries emphasized and healthy self-care education provided Patient was educated on available mental health resources within their area that accept Medicaid and offer counseling and psychiatry. Email sent to patient today with local mental health therapist, crisis support resources and [facility]'s walk in clinic hours Patient denies transportation needs but does wish to go to [facility] as this office is closest to her and she wishes to do in person therapy.  Care coordination call made to [facility] April Lara provided education on relaxation techniques such as meditation, deep breathing, massage, grounding exercises or yoga that can activate the body's relaxation response and ease symptoms of stress and anxiety. April Lara ask that when pt is struggling with difficult emotions and racing thoughts that they start this relaxation response process. April Lara provided extensive education on healthy coping skills for anxiety. SW used active and reflective listening, validated patient's feelings/concerns, and provided emotional support. Patient will work on implementing appropriate self-care habits into their daily routine such as: staying positive, writing a  gratitude list, drinking water, staying active around the house, taking their medications as prescribed, combating negative thoughts or emotions and staying connected with their family and friends. Positive reinforcement provided for this decision to work on this.  Motivational Interviewing employed Depression screen reviewed  PHQ2/ PHQ9 completed or reviewed  Mindfulness or Relaxation training provided Active listening / Reflection utilized  Advance Care and HCPOA education provided Emotional Support Provided Problem Solving /Task Center strategies reviewed Provided psychoeducation for mental health needs  Provided brief CBT  Reviewed mental health medications and discussed importance of compliance:  Quality of sleep assessed & Sleep Hygiene techniques promoted  Participation in counseling encouraged  Verbalization of feelings encouraged  Suicidal Ideation/Homicidal Ideation assessed: Patient denies SI/HI  Review resources, discussed options and provided patient information about  Mental Health Resources Inter-disciplinary care team collaboration (see longitudinal plan of care) Patient Goals/Self-Care Activities: Over the next 120 days Attend scheduled medical appointments Utilize healthy coping skills and supportive resources discussed Contact PCP with any questions or concerns Keep 90 percent of counseling appointments Call your insurance provider for more information about your Enhanced Benefits  Check out counseling resources provided  Begin personal counseling with April Lara, to reduce and manage symptoms of Depression and Stress, until well-established with mental health provider Accept all calls from representative with [place] and [facility] in an effort to establish ongoing mental health counseling and supportive services. Incorporate into daily practice - relaxation techniques, deep breathing exercises, and mindfulness  meditation strategies. Talk about feelings with  friends, family members, spiritual advisor, etc. Contact April Lara directly [phone]), if you have questions, need assistance, or if additional social work needs are identified between now and our next scheduled telephone outreach call. Call 988 for mental health hotline/crisis line if needed (24/7 available) Try techniques to reduce symptoms of anxiety/negative thinking (deep breathing, distraction, positive self talk, etc)  - develop a personal safety plan - develop a plan to deal with triggers like holidays, anniversaries - exercise at least 2 to 3 times per week - have a plan for how to handle bad days - journal feelings and what helps to feel better or worse - spend time or talk with others at least 2 to 3 times per week - watch for early signs of feeling worse - begin personal counseling - call and visit an old friend - check out volunteer opportunities - join a support group - laugh; watch a funny movie or comedian - learn and use visualization or guided imagery - perform a random act of kindness - practice relaxation or meditation daily - start or continue a personal journal - practice positive thinking and self-talk -continue with compliance of taking medication  -identify current effective and ineffective coping strategies.  -implement positive self-talk in care to increase self-esteem, confidence and feelings of control.  -consider alternative and complementary therapy approaches such as meditation, mindfulness or yoga.  -journaling, prayer, worship services, meditation or pastoral counseling.  -increase participation in pleasurable group activities such as hobbies, singing, sports or volunteering).  -consider the use of meditative movement therapy such as tai chi, yoga or qigong.  -start a regular daily exercise program based on tolerance, ability and patient choice to support positive thinking and activity    If you are experiencing a Mental Health or Behavioral Health  Crisis or need someone to talk to, please call the Suicide and Crisis Lifeline: 988    Patient Goals: Initial goal     Follow up:  Patient agrees to Care Plan and Follow-up.  Plan: The Managed Medicaid care management team will reach out to the patient again over the next 30 days.  Dickie La, BSW, MSW, [employer] Managed Medicaid April Lara [facility]  [facility] [email or web address] Phone: [phone]

## 2022-12-02 ENCOUNTER — Other Ambulatory Visit: Payer: Medicaid Other | Admitting: Licensed Clinical Social Worker

## 2022-12-02 NOTE — Patient Outreach (Signed)
Medicaid Managed Care Social Work Note  12/02/2022 Name:  April Lara MRN:  [id number] DOB:  26-Nov-2000  April Lara is an 22 y.o. year old female who is a primary patient of Lockie Mola, MD.  The Medicaid Managed Care Coordination team was consulted for assistance with:  Mental Health Counseling and Resources  April Lara was given information about Medicaid Managed Care Coordination team services today. April Lara Patient agreed to services and verbal consent obtained.  Engaged with patient  for by telephone forfollow up visit in response to referral for case management and/or care coordination services.   Assessments/Interventions:  Review of past medical history, allergies, medications, health status, including review of consultants reports, laboratory and other test data, was performed as part of comprehensive evaluation and provision of chronic care management services.  SDOH: (Social Determinant of Health) assessments and interventions performed: SDOH Interventions    Flowsheet Row Patient Outreach Telephone from 11/24/2022 in [place] POPULATION HEALTH DEPARTMENT Office Visit from 11/13/2022 in [facility] Office Visit from 11/25/2016 in [place] [facility]  SDOH Interventions     Depression Interventions/Treatment  Medication, Counseling Medication, Counseling Medication, Counseling  Stress Interventions Offered [employer], Provide Counseling -- --       Advanced Directives Status:  See Care Plan for related entries.  Care Plan                 No Known Allergies  Medications Reviewed Today     Reviewed by Pamelia Hoit, CMA (Certified Medical Assistant) on 11/13/22 at 1352  Med List Status: <None>   Medication Order Taking? Sig Documenting Provider Last Dose Status Informant  cetirizine (ZYRTEC) 10 MG tablet [id number]  No Take 1 tablet (10 mg total) by mouth daily. For allergies Ettefagh, Aron Baba, MD Taking Active   fluticasone [facility]) 50 MCG/ACT nasal spray [id number] No Place 1-2 sprays into both nostrils daily. Ettefagh, Aron Baba, MD Taking Active   Norethindrone Acetate-Ethinyl Estradiol (JUNEL 1.5/30) 1.5-30 MG-MCG tablet [id number]  Take 1 tablet by mouth daily. 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   Seasonal and perennial allergic rhinoconjunctivitis 03/30/2015    Conditions to be addressed/monitored per PCP order:  Anxiety  Care Plan : LCSW Plan of Care  Updates made by Gustavus Bryant, LCSW since 12/02/2022 12:00 AM     Problem: Anxiety Identification (Anxiety)      Goal: Anxiety Symptoms Identified   Start Date: 11/24/2022  Note:   Priority: High  Timeframe:  Short Range Goal Priority:  High Start Date:   11/24/22       Expected End Date:  ongoing                     Follow Up Date--12/23/22 at 2 pm  - keep 90 percent of scheduled appointments -consider counseling or psychiatry -consider bumping up your self-care  -consider creating a stronger support network   Why is this important?             Combatting depression may take some time.  If you don't feel better right away, don't give up on your treatment plan.    Current barriers:   Chronic Mental Health needs related to depression, stress and anxiety. Patient requires Support, Education, Resources, Referrals, Advocacy, and Care Coordination, in order to meet Unmet Mental Health Needs. Patient will implement clinical interventions discussed today to decrease symptoms of depression and increase knowledge and/or ability of: coping  skills. Mental Health Concerns and Social Isolation Patient lacks knowledge of available community counseling agencies and resources.  Clinical Goal(s): verbalize understanding of plan for management of Anxiety, Depression, and Stress and demonstrate a reduction in symptoms. Patient will connect with a provider for ongoing mental health treatment, increase coping skills, healthy habits, self-management skills, and stress reduction        Clinical Interventions:  Assessed patient's previous and current treatment, coping skills, support system and barriers to care. Patient provided hx  Verbalization of feelings encouraged, motivational interviewing employed Emotional support provided, positive coping strategies explored. Establishing healthy boundaries emphasized and healthy self-care education provided Patient was educated on available mental health resources within their area that accept Medicaid and offer counseling and psychiatry. Email sent to patient today with local mental health therapist, crisis support resources and [facility]'s walk in clinic hours Patient denies transportation needs but does wish to go to [facility] as this office is closest to her and she wishes to do in person therapy.  Care coordination call made to [facility] and email sent. Patient has agreed to physically go to location to question about services as [facility] LCSW and patient made joint phone call together to [facility] and was unable to leave a voice message. Patient reports having finals coming up during the first week of May. Emotional support and self-care education provided.  LCSW provided education on relaxation techniques such as meditation, deep breathing, massage, grounding exercises or yoga that can activate the body's relaxation response and ease symptoms of stress and anxiety. LCSW ask that when pt is struggling with difficult emotions and racing thoughts that they start this relaxation response process.  LCSW provided extensive education on healthy coping skills for anxiety. SW used active and reflective listening, validated patient's feelings/concerns, and provided emotional support. Patient will work on implementing appropriate self-care habits into their daily routine such as: staying positive, writing a gratitude list, drinking water, staying active around the house, taking their medications as prescribed, combating negative thoughts or emotions and staying connected with their family and friends. Positive reinforcement provided for this decision to work on this.  Motivational Interviewing employed Depression screen reviewed  PHQ2/ PHQ9 completed or reviewed  Mindfulness or Relaxation training provided Active listening / Reflection utilized  Advance Care and HCPOA education provided Emotional Support Provided Problem Solving /Task Center strategies reviewed Provided psychoeducation for mental health needs  Provided brief CBT  Reviewed mental health medications and discussed importance of compliance:  Quality of sleep assessed & Sleep Hygiene techniques promoted  Participation in counseling encouraged  Verbalization of feelings encouraged  Suicidal Ideation/Homicidal Ideation assessed: Patient denies SI/HI  Review resources, discussed options and provided patient information about  Mental Health Resources Inter-disciplinary care team collaboration (see longitudinal plan of care) Patient Goals/Self-Care Activities: Over the next 120 days Attend scheduled medical appointments Utilize healthy coping skills and supportive resources discussed Contact PCP with any questions or concerns Keep 90 percent of counseling appointments Call your insurance provider for more information about your Enhanced Benefits  Check out counseling resources provided  Begin personal  counseling with LCSW, to reduce and manage symptoms of Depression and Stress, until well-established with mental health  provider Accept all calls from representative with [place] [facility] in an effort to establish ongoing mental health counseling and supportive services. Incorporate into daily practice - relaxation techniques, deep breathing exercises, and mindfulness meditation strategies. Talk about feelings with friends, family members, spiritual advisor, etc. Contact LCSW directly [phone]), if you have questions, need assistance, or if additional social work needs are identified between now and our next scheduled telephone outreach call. Call 988 for mental health hotline/crisis line if needed (24/7 available) Try techniques to reduce symptoms of anxiety/negative thinking (deep breathing, distraction, positive self talk, etc)  - develop a personal safety plan - develop a plan to deal with triggers like holidays, anniversaries - exercise at least 2 to 3 times per week - have a plan for how to handle bad days - journal feelings and what helps to feel better or worse - spend time or talk with others at least 2 to 3 times per week - watch for early signs of feeling worse - begin personal counseling - call and visit an old friend - check out volunteer opportunities - join a support group - laugh; watch a funny movie or comedian - learn and use visualization or guided imagery - perform a random act of kindness - practice relaxation or meditation daily - start or continue a personal journal - practice positive thinking and self-talk -continue with compliance of taking medication  -identify current effective and ineffective coping strategies.  -implement positive self-talk in care to increase self-esteem, confidence and feelings of control.  -consider alternative and complementary therapy approaches such as meditation, mindfulness or yoga.  -journaling, prayer, worship services, meditation or pastoral counseling.  -increase participation in pleasurable group activities such as  hobbies, singing, sports or volunteering).  -consider the use of meditative movement therapy such as tai chi, yoga or qigong.  -start a regular daily exercise program based on tolerance, ability and patient choice to support positive thinking and activity    If you are experiencing a Mental Health or Behavioral Health Crisis or need someone to talk to, please call the Suicide and Crisis Lifeline: 988    Patient Goals: Follow up goal     Follow up:  Patient agrees to Care Plan and Follow-up.  Plan: The Managed Medicaid care management team will reach out to the patient again over the next 30 days.  Dickie La, BSW, MSW, [employer] Managed Medicaid LCSW [facility]  [facility] [email or web address] Phone: [phone]

## 2022-12-02 NOTE — Patient Instructions (Signed)
Visit Information  Ms. Freddie Nghiem was given information about Medicaid Managed Care team care coordination services as a part of their Healthy Blue Medicaid benefit. Marilynn Rail Benavides verbally consented to engagement with the [facility] Managed Care team.   If you are experiencing a medical emergency, please call 911 or report to your local emergency department or urgent care.   If you have a non-emergency medical problem during routine business hours, please contact your provider's office and ask to speak with a nurse.   For questions related to your Healthy [facility] health plan, please call: [phone] or visit the homepage here: [email or web address]  If you would like to schedule transportation through your Healthy [facility] plan, please call the following number at least 2 days in advance of your appointment: [phone]  For information about your ride after you set it up, call Ride Assist at [phone]. Use this number to activate a Will Call pickup, or if your transportation is late for a scheduled pickup. Use this number, too, if you need to make a change or cancel a previously scheduled reservation.  If you need transportation services right away, call [phone]. The after-hours call center is staffed 24 hours to handle ride assistance and urgent reservation requests (including discharges) 365 days a year. Urgent trips include sick visits, hospital discharge requests and life-sustaining treatment.  Call the [facility] Line at [phone], at any time, 24 hours a day, 7 days a week. If you are in danger or need immediate medical attention call 911.  If you would like help to quit smoking, call 1-800-QUIT-NOW ((260) 846-6811) OR Espaol: 1-855-Djelo-Ya (8-756-433-2951) o para ms informacin haga clic aqu or Text READY to 884-166 to register via text   Following is a copy of your plan of care:  Care Plan  : LCSW Plan of Care  Updates made by Gustavus Bryant, LCSW since 12/02/2022 12:00 AM     Problem: Anxiety Identification (Anxiety)      Goal: Anxiety Symptoms Identified   Start Date: 11/24/2022  Note:   Priority: High  Timeframe:  Short Range Goal Priority:  High Start Date:   11/24/22       Expected End Date:  ongoing                     Follow Up Date--12/23/22 at 2 pm  - keep 90 percent of scheduled appointments -consider counseling or psychiatry -consider bumping up your self-care  -consider creating a stronger support network   Why is this important?             Combatting depression may take some time.            If you don't feel better right away, don't give up on your treatment plan.    Current barriers:   Chronic Mental Health needs related to depression, stress and anxiety. Patient requires Support, Education, Resources, Referrals, Advocacy, and Care Coordination, in order to meet Unmet Mental Health Needs. Patient will implement clinical interventions discussed today to decrease symptoms of depression and increase knowledge and/or ability of: coping skills. Mental Health Concerns and Social Isolation Patient lacks knowledge of available community counseling agencies and resources.  Clinical Goal(s): verbalize understanding of plan for management of Anxiety, Depression, and Stress and demonstrate a reduction in symptoms. Patient will connect with a provider for ongoing mental health treatment, increase coping skills, healthy habits, self-management skills, and stress reduction         Patient  Goals/Self-Care Activities: Over the next 120 days Attend scheduled medical appointments Utilize healthy coping skills and supportive resources discussed Contact PCP with any questions or concerns Keep 90 percent of counseling appointments Call your insurance provider for more information about your Enhanced Benefits  Check out counseling resources provided  Begin personal  counseling with LCSW, to reduce and manage symptoms of Depression and Stress, until well-established with mental health provider Accept all calls from representative with [place] [facility] in an effort to establish ongoing mental health counseling and supportive services. Incorporate into daily practice - relaxation techniques, deep breathing exercises, and mindfulness meditation strategies. Talk about feelings with friends, family members, spiritual advisor, etc. Contact LCSW directly [phone]), if you have questions, need assistance, or if additional social work needs are identified between now and our next scheduled telephone outreach call. Call 988 for mental health hotline/crisis line if needed (24/7 available) Try techniques to reduce symptoms of anxiety/negative thinking (deep breathing, distraction, positive self talk, etc)  - develop a personal safety plan - develop a plan to deal with triggers like holidays, anniversaries - exercise at least 2 to 3 times per week - have a plan for how to handle bad days - journal feelings and what helps to feel better or worse - spend time or talk with others at least 2 to 3 times per week - watch for early signs of feeling worse - begin personal counseling - call and visit an old friend - check out volunteer opportunities - join a support group - laugh; watch a funny movie or comedian - learn and use visualization or guided imagery - perform a random act of kindness - practice relaxation or meditation daily - start or continue a personal journal - practice positive thinking and self-talk -continue with compliance of taking medication  -identify current effective and ineffective coping strategies.  -implement positive self-talk in care to increase self-esteem, confidence and feelings of control.  -consider alternative and complementary therapy approaches such as meditation, mindfulness or yoga.  -journaling, prayer,  worship services, meditation or pastoral counseling.  -increase participation in pleasurable group activities such as hobbies, singing, sports or volunteering).  -consider the use of meditative movement therapy such as tai chi, yoga or qigong.  -start a regular daily exercise program based on tolerance, ability and patient choice to support positive thinking and activity    If you are experiencing a Mental Health or Behavioral Health Crisis or need someone to talk to, please call the Suicide and Crisis Lifeline: 988    Patient Goals: Follow up goal

## 2022-12-05 ENCOUNTER — Other Ambulatory Visit: Payer: Self-pay | Admitting: Family Medicine

## 2022-12-05 DIAGNOSIS — F39 Unspecified mood [affective] disorder: Secondary | ICD-10-CM

## 2022-12-23 ENCOUNTER — Other Ambulatory Visit: Payer: Medicaid Other | Admitting: Licensed Clinical Social Worker

## 2022-12-23 NOTE — Patient Instructions (Signed)
Visit Information  April Lara was given information about Medicaid Managed Care team care coordination services as a part of their Healthy Blue Medicaid benefit. April Lara  denies need* to engagement with the [facility] Managed Care team.   If you are experiencing a medical emergency, please call 911 or report to your local emergency department or urgent care.   If you have a non-emergency medical problem during routine business hours, please contact your provider's office and ask to speak with a nurse.   For questions related to your Healthy [facility] health plan, please call: [phone] or visit the homepage here: [email or web address]  If you would like to schedule transportation through your Healthy [facility] plan, please call the following number at least 2 days in advance of your appointment: [phone]  For information about your ride after you set it up, call Ride Assist at [phone]. Use this number to activate a Will Call pickup, or if your transportation is late for a scheduled pickup. Use this number, too, if you need to make a change or cancel a previously scheduled reservation.  If you need transportation services right away, call [phone]. The after-hours call center is staffed 24 hours to handle ride assistance and urgent reservation requests (including discharges) 365 days a year. Urgent trips include sick visits, hospital discharge requests and life-sustaining treatment.  Call the [facility] Line at [phone], at any time, 24 hours a day, 7 days a week. If you are in danger or need immediate medical attention call 911.  If you would like help to quit smoking, call 1-800-QUIT-NOW ((669) 378-2792) OR Espaol: 1-855-Djelo-Ya (2-355-732-2025) o para ms informacin haga clic aqu or Text READY to 427-062 to register via text   Following is a copy of your plan of care:  Care Plan :  LCSW Plan of Care  Updates made by Gustavus Bryant, LCSW since 12/23/2022 12:00 AM     Problem: Anxiety Identification (Anxiety)      Goal: Anxiety Symptoms Identified   Start Date: 11/24/2022  Expected End Date: 12/23/2022  Note:   Priority: High  Timeframe:  Short Range Goal Priority:  High Start Date:   11/24/22       Expected End Date:  ongoing                     Follow Up Date--12/23/22 at 2 pm, goal ended on 12/23/22- per patient, she is doing better and wishes to pursue counseling services in the future. She denies any further social work needs at this time.   - keep 90 percent of scheduled appointments -consider counseling or psychiatry -consider bumping up your self-care  -consider creating a stronger support network   Why is this important?             Combatting depression may take some time.            If you don't feel better right away, don't give up on your treatment plan.    Current barriers:   Chronic Mental Health needs related to depression, stress and anxiety. Patient requires Support, Education, Resources, Referrals, Advocacy, and Care Coordination, in order to meet Unmet Mental Health Needs. Patient will implement clinical interventions discussed today to decrease symptoms of depression and increase knowledge and/or ability of: coping skills. Mental Health Concerns and Social Isolation Patient lacks knowledge of available community counseling agencies and resources.  Clinical Goal(s): verbalize understanding of plan for management of Anxiety, Depression, and Stress  and demonstrate a reduction in symptoms. Patient will connect with a provider for ongoing mental health treatment, increase coping skills, healthy habits, self-management skills, and stress reduction        Clinical Interventions:  Assessed patient's previous and current treatment, coping skills, support system and barriers to care. Patient provided hx  Verbalization of feelings encouraged,  motivational interviewing employed Emotional support provided, positive coping strategies explored. Establishing healthy boundaries emphasized and healthy self-care education provided Patient was educated on available mental health resources within their area that accept Medicaid and offer counseling and psychiatry. Email sent to patient today with local mental health therapist, crisis support resources and [facility]'s walk in clinic hours Patient denies transportation needs but does wish to go to [facility] as this office is closest to her and she wishes to do in person therapy.  Care coordination call made to [facility] and email sent. Patient has agreed to physically go to location to question about services as [facility] LCSW and patient made joint phone call together to [facility] and was unable to leave a voice message. Patient reports having finals coming up during the first week of May. Emotional support and self-care education provided.  LCSW provided education on relaxation techniques such as meditation, deep breathing, massage, grounding exercises or yoga that can activate the body's relaxation response and ease symptoms of stress and anxiety. LCSW ask that when pt is struggling with difficult emotions and racing thoughts that they start this relaxation response process. LCSW provided extensive education on healthy coping skills for anxiety. SW used active and reflective listening, validated patient's feelings/concerns, and provided emotional support. Patient will work on implementing appropriate self-care habits into their daily routine such as: staying positive, writing a gratitude list, drinking water, staying active around the house, taking their medications as prescribed, combating negative thoughts or emotions and staying connected with their family and friends. Positive reinforcement provided for this decision to work on this.  Motivational Interviewing employed Depression screen  reviewed  PHQ2/ PHQ9 completed or reviewed  Mindfulness or Relaxation training provided Active listening / Reflection utilized  Advance Care and HCPOA education provided Emotional Support Provided Problem Solving /Task Center strategies reviewed Provided psychoeducation for mental health needs  Provided brief CBT  Reviewed mental health medications and discussed importance of compliance:  Quality of sleep assessed & Sleep Hygiene techniques promoted  Participation in counseling encouraged  Verbalization of feelings encouraged  Suicidal Ideation/Homicidal Ideation assessed: Patient denies SI/HI  Review resources, discussed options and provided patient information about  Mental Health Resources Inter-disciplinary care team collaboration (see longitudinal plan of care) 12/23/22- Patient reports that she is adjusting to her recent living transition and is feeling better emotionally. She denies wanting to pursue any mental health treatment at this time but was very appreciative of the resource information and support provided. Final email sent today to patient with contact information for [facility] LCSW in case future social work needs arise.  Patient Goals/Self-Care Activities: Over the next 120 days Attend scheduled medical appointments Utilize healthy coping skills and supportive resources discussed Contact PCP with any questions or concerns Keep 90 percent of counseling appointments Call your insurance provider for more information about your Enhanced Benefits  Check out counseling resources provided  Begin personal counseling with LCSW, to reduce and manage symptoms of Depression and Stress, until well-established with mental health provider Accept all calls from representative with [place] [facility] in an effort to establish ongoing mental health counseling  and supportive services. Incorporate into daily practice - relaxation techniques, deep breathing exercises, and  mindfulness meditation strategies. Talk about feelings with friends, family members, spiritual advisor, etc. Contact LCSW directly [phone]), if you have questions, need assistance, or if additional social work needs are identified between now and our next scheduled telephone outreach call. Call 988 for mental health hotline/crisis line if needed (24/7 available) Try techniques to reduce symptoms of anxiety/negative thinking (deep breathing, distraction, positive self talk, etc)  - develop a personal safety plan - develop a plan to deal with triggers like holidays, anniversaries - exercise at least 2 to 3 times per week - have a plan for how to handle bad days - journal feelings and what helps to feel better or worse - spend time or talk with others at least 2 to 3 times per week - watch for early signs of feeling worse - begin personal counseling - call and visit an old friend - check out volunteer opportunities - join a support group - laugh; watch a funny movie or comedian - learn and use visualization or guided imagery - perform a random act of kindness - practice relaxation or meditation daily - start or continue a personal journal - practice positive thinking and self-talk -continue with compliance of taking medication  -identify current effective and ineffective coping strategies.  -implement positive self-talk in care to increase self-esteem, confidence and feelings of control.  -consider alternative and complementary therapy approaches such as meditation, mindfulness or yoga.  -journaling, prayer, worship services, meditation or pastoral counseling.  -increase participation in pleasurable group activities such as hobbies, singing, sports or volunteering).  -consider the use of meditative movement therapy such as tai chi, yoga or qigong.  -start a regular daily exercise program based on tolerance, ability and patient choice to support positive thinking and activity    If  you are experiencing a Mental Health or Behavioral Health Crisis or need someone to talk to, please call the Suicide and Crisis Lifeline: 988    Patient Goals: Follow up goal

## 2022-12-23 NOTE — Patient Outreach (Signed)
Medicaid Managed Care Social Work Note  12/23/2022 Name:  April Lara MRN:  [id number] DOB:  [birth year]  April Lara is an 22 y.o. year old female who is a primary patient of Lockie Mola, MD.  The Medicaid Managed Care Coordination team was consulted for assistance with:  Mental Health Counseling and Resources  April Lara was given information about Medicaid Managed Care Coordination team services today. April Lara Patient agreed to services and verbal consent obtained.  Engaged with patient  for by telephone forfollow up visit in response to referral for case management and/or care coordination services.   Assessments/Interventions:  Review of past medical history, allergies, medications, health status, including review of consultants reports, laboratory and other test data, was performed as part of comprehensive evaluation and provision of chronic care management services.  SDOH: (Social Determinant of Health) assessments and interventions performed: SDOH Interventions    Flowsheet Row Patient Outreach Telephone from 11/24/2022 in [place] [facility] Office Visit from 11/13/2022 in [facility] Office Visit from 11/25/2016 in [facility]  SDOH Interventions     Depression Interventions/Treatment  Medication, Counseling Medication, Counseling Medication, Counseling  Stress Interventions Offered [employer], Provide Counseling -- --       Advanced Directives Status:  See Care Plan for related entries.  Care Plan                 No Known Allergies  Medications Reviewed Today     Reviewed by Pamelia Hoit, CMA (Certified Medical Assistant) on 11/13/22 at 1352  Med List Status: <None>   Medication Order Taking? Sig Documenting Provider Last Dose Status Informant  cetirizine (ZYRTEC) 10 MG tablet [id number]  No Take 1 tablet (10 mg total) by mouth daily. For allergies Ettefagh, Aron Baba, MD Taking Active   fluticasone [facility]) 50 MCG/ACT nasal spray [id number] No Place 1-2 sprays into both nostrils daily. Ettefagh, Aron Baba, MD Taking Active   Norethindrone Acetate-Ethinyl Estradiol (JUNEL 1.5/30) 1.5-30 MG-MCG tablet [id number]  Take 1 tablet by mouth daily. 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   Seasonal and perennial allergic rhinoconjunctivitis 03/30/2015    Conditions to be addressed/monitored per PCP order:  Anxiety  Care Plan : LCSW Plan of Care  Updates made by Gustavus Bryant, LCSW since 12/23/2022 12:00 AM     Problem: Anxiety Identification (Anxiety)      Goal: Anxiety Symptoms Identified   Start Date: 11/24/2022  Expected End Date: 12/23/2022  Note:   Priority: High  Timeframe:  Short Range Goal Priority:  High Start Date:   11/24/22       Expected End Date:  ongoing                     Follow Up Date--12/23/22 at 2 pm, goal ended on 12/23/22- per patient, she is doing better and wishes to pursue counseling services in the future. She denies any further social work needs at this time.   - keep 90 percent of scheduled appointments -consider counseling or psychiatry -consider bumping  up your self-care  -consider creating a stronger support network   Why is this important?             Combatting depression may take some time.            If you don't feel better right away, don't give up on your treatment plan.    Current barriers:   Chronic Mental Health needs related to depression, stress and anxiety. Patient requires Support, Education, Resources, Referrals, Advocacy, and Care Coordination,  in order to meet Unmet Mental Health Needs. Patient will implement clinical interventions discussed today to decrease symptoms of depression and increase knowledge and/or ability of: coping skills. Mental Health Concerns and Social Isolation Patient lacks knowledge of available community counseling agencies and resources.  Clinical Goal(s): verbalize understanding of plan for management of Anxiety, Depression, and Stress and demonstrate a reduction in symptoms. Patient will connect with a provider for ongoing mental health treatment, increase coping skills, healthy habits, self-management skills, and stress reduction        Clinical Interventions:  Assessed patient's previous and current treatment, coping skills, support system and barriers to care. Patient provided hx  Verbalization of feelings encouraged, motivational interviewing employed Emotional support provided, positive coping strategies explored. Establishing healthy boundaries emphasized and healthy self-care education provided Patient was educated on available mental health resources within their area that accept Medicaid and offer counseling and psychiatry. Email sent to patient today with local mental health therapist, crisis support resources and [facility]'s walk in clinic hours Patient denies transportation needs but does wish to go to [facility] as this office is closest to her and she wishes to do in person therapy.  Care coordination call made to [facility] and email sent. Patient has agreed to physically go to location to question about services as [facility] LCSW and patient made joint phone call together to [facility] and was unable to leave a voice message. Patient reports having finals coming up during the first week of May. Emotional support and self-care education provided.  LCSW provided education on relaxation techniques such as meditation, deep breathing, massage, grounding exercises or yoga that can activate the body's  relaxation response and ease symptoms of stress and anxiety. LCSW ask that when pt is struggling with difficult emotions and racing thoughts that they start this relaxation response process. LCSW provided extensive education on healthy coping skills for anxiety. SW used active and reflective listening, validated patient's feelings/concerns, and provided emotional support. Patient will work on implementing appropriate self-care habits into their daily routine such as: staying positive, writing a gratitude list, drinking water, staying active around the house, taking their medications as prescribed, combating negative thoughts or emotions and staying connected with their family and friends. Positive reinforcement provided for this decision to work on this.  Motivational Interviewing employed Depression screen reviewed  PHQ2/ PHQ9 completed or reviewed  Mindfulness or Relaxation training provided Active listening / Reflection utilized  Advance Care and HCPOA education provided Emotional Support Provided Problem Solving /Task Center strategies reviewed Provided psychoeducation for mental health needs  Provided brief CBT  Reviewed mental health medications and discussed importance of compliance:  Quality of sleep assessed & Sleep Hygiene techniques promoted  Participation in counseling encouraged  Verbalization of feelings encouraged  Suicidal Ideation/Homicidal Ideation assessed: Patient denies SI/HI  Review resources, discussed options and provided patient information about  Mental Health Resources Inter-disciplinary care team collaboration (see longitudinal plan of care) 12/23/22- Patient reports that she is adjusting to  her recent living transition and is feeling better emotionally. She denies wanting to pursue any mental health treatment at this time but was very appreciative of the resource information and support provided. Final email sent today to patient with contact information for [facility] LCSW  in case future social work needs arise.  Patient Goals/Self-Care Activities: Over the next 120 days Attend scheduled medical appointments Utilize healthy coping skills and supportive resources discussed Contact PCP with any questions or concerns Keep 90 percent of counseling appointments Call your insurance provider for more information about your Enhanced Benefits  Check out counseling resources provided  Begin personal counseling with LCSW, to reduce and manage symptoms of Depression and Stress, until well-established with mental health provider Accept all calls from representative with [facility] in an effort to establish ongoing mental health counseling and supportive services. Incorporate into daily practice - relaxation techniques, deep breathing exercises, and mindfulness meditation strategies. Talk about feelings with friends, family members, spiritual advisor, etc. Contact LCSW directly [phone]), if you have questions, need assistance, or if additional social work needs are identified between now and our next scheduled telephone outreach call. Call 988 for mental health hotline/crisis line if needed (24/7 available) Try techniques to reduce symptoms of anxiety/negative thinking (deep breathing, distraction, positive self talk, etc)  - develop a personal safety plan - develop a plan to deal with triggers like holidays, anniversaries - exercise at least 2 to 3 times per week - have a plan for how to handle bad days - journal feelings and what helps to feel better or worse - spend time or talk with others at least 2 to 3 times per week - watch for early signs of feeling worse - begin personal counseling - call and visit an old friend - check out volunteer opportunities - join a support group - laugh; watch a funny movie or comedian - learn and use visualization or guided imagery - perform a random act of kindness - practice relaxation or meditation  daily - start or continue a personal journal - practice positive thinking and self-talk -continue with compliance of taking medication  -identify current effective and ineffective coping strategies.  -implement positive self-talk in care to increase self-esteem, confidence and feelings of control.  -consider alternative and complementary therapy approaches such as meditation, mindfulness or yoga.  -journaling, prayer, worship services, meditation or pastoral counseling.  -increase participation in pleasurable group activities such as hobbies, singing, sports or volunteering).  -consider the use of meditative movement therapy such as tai chi, yoga or qigong.  -start a regular daily exercise program based on tolerance, ability and patient choice to support positive thinking and activity    If you are experiencing a Mental Health or Behavioral Health Crisis or need someone to talk to, please call the Suicide and Crisis Lifeline: 988    Patient Goals: Discharge goal     Follow up:  Patient requests no follow-up at this time.  Plan: The Managed Medicaid care management team is available to follow up with the patient after provider conversation with the patient regarding recommendation for care management engagement and subsequent re-referral to the care management team.   Dickie La, BSW, MSW, LCSW Managed Medicaid LCSW [facility]  [facility] [email or web address] Phone: [phone]

## 2023-01-04 ENCOUNTER — Other Ambulatory Visit: Payer: Self-pay | Admitting: Family Medicine

## 2023-01-04 DIAGNOSIS — Z30011 Encounter for initial prescription of contraceptive pills: Secondary | ICD-10-CM

## 2023-01-06 ENCOUNTER — Ambulatory Visit: Payer: Medicaid Other | Admitting: Family Medicine

## 2023-01-06 ENCOUNTER — Encounter: Payer: Self-pay | Admitting: Family Medicine

## 2023-01-06 VITALS — BP 102/60 | HR 102 | Ht 62.0 in | Wt 91.0 lb

## 2023-01-06 DIAGNOSIS — Z30011 Encounter for initial prescription of contraceptive pills: Secondary | ICD-10-CM

## 2023-01-06 DIAGNOSIS — R1909 Other intra-abdominal and pelvic swelling, mass and lump: Secondary | ICD-10-CM | POA: Insufficient documentation

## 2023-01-06 DIAGNOSIS — F419 Anxiety disorder, unspecified: Secondary | ICD-10-CM | POA: Diagnosis not present

## 2023-01-06 MED ORDER — NORETHINDRONE ACET-ETHINYL EST 1.5-30 MG-MCG PO TABS: 1.0000 | ORAL_TABLET | Freq: Every day | ORAL | 3 refills | Status: DC

## 2023-01-06 NOTE — Progress Notes (Signed)
    SUBJECTIVE:   CHIEF COMPLAINT / HPI:   Anxiety  Patient says she is feeling much better. Says she took 2 weeks of Lexapro and she said that she did not like the side effects that she stopped taking the medication.  Says that she feels much better now.  Says she feels less anxious as she moved out of her parents house and now lives with her friend.  She has also now graduated college and started a full-time job.  Says that she feels like she has adequate coping mechanisms.  Does not want to start medication again at this time.  Groin mass Patient reports that about a week ago she noticed a bump in her inguinal crease when she was shaving.  Says that it was painful and about the size of her fingertip.  Denies any drainage.  Denies any fevers.  Denies sexual intercourse in the last couple months.  Has never had something like this before.   PERTINENT  PMH / PSH: Anxiety   OBJECTIVE:   BP 102/60   Pulse (!) 102   Ht 5\' 2"  (1.575 m)   Wt 91 lb (41.3 kg)   LMP 12/19/2022   SpO2 98%   BMI 16.64 kg/m   General: well appearing, in no acute distress CV: RRR, radial pulses equal and palpable, no BLE edema  Resp: Normal work of breathing on room air, CTAB Abd: Soft, non tender, non distended  Skin: 2 cm [place] shaped nodule underneath the skin within right inguinal crease along area that would rub against line of underwear.  No central ulceration or drainage point.  Mobile, tender to palpation, not erythematous.  No lymphadenopathy Neuro: Alert & Oriented x 4    ASSESSMENT/PLAN:   Anxiety Assessment & Plan: Well-controlled despite no medication therapy.  Most likely that patient does have GAD however is controlled now.  Environmental stressors due to caused patient to have significant distress thus counseled patient regarding starting therapy.  Patient does have barriers to in person therapy so discussed options for online therapy resources.  GAD screening has significantly improved. -  Follow-up as needed. - Discontinue Lexapro per patient preference.   Nodule of groin Assessment & Plan: Groin nodule most likely due to friction of undergarment against the skin.  Especially in an area of increased moisture.  Most likely reactive in nature and could start to drain.  Unlikely abscess as there is no fluctuance or induration.  Unlikely malignant as it is mobile and is tender to palpation. - Continue to monitor.  Given patient return precautions of fevers, drainage from area, increased in size. - Educated patient to use warm compresses and avoid tight clothing in that area.       Lockie Mola, MD [facility]

## 2023-01-06 NOTE — Assessment & Plan Note (Signed)
Groin nodule most likely due to friction of undergarment against the skin.  Especially in an area of increased moisture.  Most likely reactive in nature and could start to drain.  Unlikely abscess as there is no fluctuance or induration.  Unlikely malignant as it is mobile and is tender to palpation. - Continue to monitor.  Given patient return precautions of fevers, drainage from area, increased in size. - Educated patient to use warm compresses and avoid tight clothing in that area.

## 2023-01-06 NOTE — Assessment & Plan Note (Signed)
Well-controlled despite no medication therapy.  Most likely that patient does have GAD however is controlled now.  Environmental stressors due to caused patient to have significant distress thus counseled patient regarding starting therapy.  Patient does have barriers to in person therapy so discussed options for online therapy resources.  GAD screening has significantly improved. - Follow-up as needed. - Discontinue Lexapro per patient preference.

## 2023-02-11 ENCOUNTER — Telehealth: Payer: Self-pay

## 2023-02-11 NOTE — Telephone Encounter (Signed)
LVM for patient to call back [phone], or to call PCP office to schedule follow up apt. AS, CMA  

## 2023-03-24 ENCOUNTER — Ambulatory Visit: Payer: Medicaid Other | Admitting: Student

## 2023-03-24 ENCOUNTER — Encounter: Payer: Self-pay | Admitting: Student

## 2023-03-24 ENCOUNTER — Other Ambulatory Visit (HOSPITAL_COMMUNITY): Admission: RE | Admit: 2023-03-24 | Payer: Medicaid Other | Source: Ambulatory Visit | Admitting: Family Medicine

## 2023-03-24 VITALS — BP 122/85 | HR 89 | Ht 62.0 in | Wt 102.2 lb

## 2023-03-24 DIAGNOSIS — Z124 Encounter for screening for malignant neoplasm of cervix: Secondary | ICD-10-CM

## 2023-03-24 DIAGNOSIS — Z113 Encounter for screening for infections with a predominantly sexual mode of transmission: Secondary | ICD-10-CM | POA: Diagnosis present

## 2023-03-24 DIAGNOSIS — B379 Candidiasis, unspecified: Secondary | ICD-10-CM | POA: Diagnosis not present

## 2023-03-24 LAB — POCT WET PREP (WET MOUNT)
Clue Cells Wet Prep Whiff POC: NEGATIVE
Trichomonas Wet Prep HPF POC: ABSENT

## 2023-03-24 MED ORDER — FLUCONAZOLE 150 MG PO TABS: 150.0000 mg | ORAL_TABLET | Freq: Once | ORAL | 1 refills | Status: AC

## 2023-03-24 NOTE — Addendum Note (Signed)
Addended by: Glendale Chard on: 03/24/2023 04:23 PM   Modules accepted: Level of Service

## 2023-03-24 NOTE — Progress Notes (Cosign Needed Addendum)
    SUBJECTIVE:   CHIEF COMPLAINT / HPI:   April Lara is a 22 y.o. female  presenting for vaginal discharge and irritation.   She reports being sexually active and would like to be screened for STD's today. She is open to a pap smear due to her mom having cervical cancer in her early 110s. She denies fever, chills, abdominal pain, dysuria.   PERTINENT  PMH / PSH: Reviewed and updated   OBJECTIVE:   BP 122/85   Pulse 89   Ht 5\' 2"  (1.575 m)   Wt 102 lb 3.2 oz (46.4 kg)   SpO2 100%   BMI 18.69 kg/m   Well-appearing, no acute distress Cardio: Regular rate, regular rhythm, no murmurs on exam. Pulm: Clear, no wheezing, no crackles. No increased work of breathing Abdominal: bowel sounds present, soft, non-tender, non-distended Extremities: no peripheral edema  Neuro: alert and oriented x3, speech normal in content, no facial asymmetry, strength intact and equal bilaterally in UE and LE, pupils equal and reactive to light.  Psych:  Cognition and judgment appear intact. Alert, communicative  and cooperative with normal attention span and concentration. No apparent delusions, illusions, hallucinations   Pelvic Exam: MA chaperone present  Normal external genitalia White chunky discharge present throughout vaginal canal  No cervical motion tenderness  Cervix visualized with no lesions       01/06/2023    2:24 PM 11/24/2022   12:26 PM 11/13/2022    1:53 PM  PHQ9 SCORE ONLY  PHQ-9 Total Score 9 16 18       ASSESSMENT/PLAN:   No problem-specific Assessment & Plan notes found for this encounter.   Vaginal Discharge:  Exam consistent with yeast infection and confirmed with Wet prep. Diflucan sent to pharmacy.  - GC pending  - HIV and RPR pending   Health Maintenance: - pap smear completed today.   Glendale Chard, DO [facility]

## 2023-03-24 NOTE — Patient Instructions (Signed)
It was great to see you today!   Today we addressed: Vaginal irritation and discharge. I will have the results of your Pap smear within 1 week. I will message you on MyChart with the results.   No future appointments.  Please arrive 15 minutes before your appointment to ensure smooth check in process.    Please call the clinic at [phone] if your symptoms worsen or you have any concerns.  Thank you for allowing me to participate in your care, Dr. Glendale Chard [facility]

## 2023-03-30 ENCOUNTER — Telehealth: Payer: Self-pay | Admitting: Student

## 2023-03-30 LAB — CYTOLOGY - PAP: Comment: NEGATIVE

## 2023-03-30 NOTE — Telephone Encounter (Signed)
Called patient to review pap results. HPV negative with ASCUS. Per Guidelines, she is good for routine screening.   Next pap will be due 03/2028.   Glendale Chard, DO [facility], PGY-2 03/30/23 1:53 PM

## 2023-04-10 ENCOUNTER — Other Ambulatory Visit: Payer: Self-pay | Admitting: Family Medicine

## 2023-04-10 DIAGNOSIS — Z30011 Encounter for initial prescription of contraceptive pills: Secondary | ICD-10-CM

## 2023-08-16 ENCOUNTER — Other Ambulatory Visit: Payer: Self-pay | Admitting: Family Medicine

## 2023-08-16 DIAGNOSIS — Z30011 Encounter for initial prescription of contraceptive pills: Secondary | ICD-10-CM

## 2023-10-08 DIAGNOSIS — H6012 Cellulitis of left external ear: Secondary | ICD-10-CM | POA: Diagnosis not present

## 2023-11-25 ENCOUNTER — Other Ambulatory Visit: Payer: Self-pay | Admitting: Family Medicine

## 2023-11-25 DIAGNOSIS — Z30011 Encounter for initial prescription of contraceptive pills: Secondary | ICD-10-CM

## 2023-12-04 DIAGNOSIS — Z20822 Contact with and (suspected) exposure to covid-19: Secondary | ICD-10-CM | POA: Diagnosis not present

## 2023-12-04 DIAGNOSIS — R059 Cough, unspecified: Secondary | ICD-10-CM | POA: Diagnosis not present

## 2023-12-04 DIAGNOSIS — J069 Acute upper respiratory infection, unspecified: Secondary | ICD-10-CM | POA: Diagnosis not present

## 2024-01-05 ENCOUNTER — Other Ambulatory Visit: Payer: Self-pay

## 2024-01-05 ENCOUNTER — Encounter (HOSPITAL_BASED_OUTPATIENT_CLINIC_OR_DEPARTMENT_OTHER): Payer: Self-pay

## 2024-01-05 ENCOUNTER — Emergency Department (HOSPITAL_BASED_OUTPATIENT_CLINIC_OR_DEPARTMENT_OTHER)
Admission: EM | Admit: 2024-01-05 | Discharge: 2024-01-05 | Disposition: A | Discharge status: Home / Self Care | Attending: Emergency Medicine | Admitting: Emergency Medicine

## 2024-01-05 DIAGNOSIS — R0981 Nasal congestion: Secondary | ICD-10-CM

## 2024-01-05 DIAGNOSIS — H938X3 Other specified disorders of ear, bilateral: Secondary | ICD-10-CM | POA: Insufficient documentation

## 2024-01-05 DIAGNOSIS — J0110 Acute frontal sinusitis, unspecified: Secondary | ICD-10-CM | POA: Diagnosis not present

## 2024-01-05 LAB — RESP PANEL BY RT-PCR (RSV, FLU A&B, COVID)  RVPGX2
Influenza A by PCR: NEGATIVE
Influenza B by PCR: NEGATIVE
Resp Syncytial Virus by PCR: NEGATIVE
SARS Coronavirus 2 by RT PCR: NEGATIVE

## 2024-01-05 MED ORDER — CETIRIZINE-PSEUDOEPHEDRINE ER 5-120 MG PO TB12: 1.0000 | ORAL_TABLET | Freq: Every day | ORAL | 0 refills | Status: AC | PRN

## 2024-01-05 MED ORDER — AMOXICILLIN-POT CLAVULANATE 875-125 MG PO TABS: 1.0000 | ORAL_TABLET | Freq: Two times a day (BID) | ORAL | 0 refills | Status: DC

## 2024-01-05 NOTE — Discharge Instructions (Signed)
 As discussed, will place on antibiotics given concern for ear infection with possible sinus infection.  You may continue to use nasal spray as well as Mucinex.  Will send in allergy medicine with a decongestion which should help with your symptoms as well.  Recommend follow-up with your primary care for reassessment.  You may follow MyChart for the results of your COVID, flu, RSV testing.  Please do not hesitate to return to the emergency department if the worrisome signs and symptoms we discussed become apparent.

## 2024-01-05 NOTE — ED Provider Notes (Signed)
 [place] EMERGENCY DEPARTMENT AT [facility] Provider Note   CSN: [id number] Arrival date & time: 01/05/24  1615     History  Chief Complaint  Patient presents with   Ear Fullness    April Lara is a 23 y.o. female.   Ear Fullness   23 year old female presents emergency department with complaints of nasal congestion, facial pressure, ear fullness.  States that symptoms been present for the past week to week and a half.  Has been trying over-the-counter Mucinex, nasal sprays without significant improvement.  Presents emergency department for further assessment.  Denies any known sick contact.  Denies any fever, chest pain, shortness of breath, abdominal pain, nausea, vomiting.  States she has history of sinus infection and feels similar.  Past medical history significant for angioedema, scoliosis, perennial allergic rhinoconjunctivitis, eczema,  Home Medications Prior to Admission medications   Medication Sig Start Date End Date Taking? Authorizing Provider  amoxicillin -clavulanate (AUGMENTIN ) 875-125 MG tablet Take 1 tablet by mouth every 12 (twelve) hours. 01/05/24  Yes Neil Balls A, PA  cetirizine -pseudoephedrine (ZYRTEC -D) 5-120 MG tablet Take 1 tablet by mouth daily as needed for rhinitis or allergies. 01/05/24  Yes Neil Balls A, PA  AUROVELA 1.5/30 1.5-30 MG-MCG tablet TAKE 1 TABLET BY MOUTH EVERY DAY 11/30/23   Dema Filler, MD  cetirizine  (ZYRTEC ) 10 MG tablet Take 1 tablet (10 mg total) by mouth daily. For allergies 06/04/21   Ettefagh, Micah Ade, MD  fluticasone  (FLONASE ) 50 MCG/ACT nasal spray Place 1-2 sprays into both nostrils daily. 06/04/21   Ettefagh, Micah Ade, MD      Allergies    Patient has no known allergies.    Review of Systems   Review of Systems  All other systems reviewed and are negative.   Physical Exam Updated Vital Signs BP (!) 125/93 (BP Location: Right Arm)   Pulse 97   Temp 97.9 F (36.6 C) (Oral)    Resp 18   SpO2 99%  Physical Exam Vitals and nursing note reviewed.  Constitutional:      General: She is not in acute distress.    Appearance: She is well-developed.  HENT:     Head: Normocephalic and atraumatic.     Comments: Maxillary sinus tenderness    Ears:     Comments: Serous middle ear effusion clear in appearance right ear.  Patient with mild purulent appearing left-sided middle ear effusion slight erythema to TM.    Nose: Congestion and rhinorrhea present.     Mouth/Throat:     Comments: Mild posterior pharyngeal erythema.  Uvula midline with symmetrical phonation.  No sublingual submandibular swelling.  No trismus. Eyes:     Conjunctiva/sclera: Conjunctivae normal.  Cardiovascular:     Rate and Rhythm: Normal rate and regular rhythm.     Heart sounds: No murmur heard. Pulmonary:     Effort: Pulmonary effort is normal. No respiratory distress.     Breath sounds: Normal breath sounds. No wheezing, rhonchi or rales.  Abdominal:     Palpations: Abdomen is soft.     Tenderness: There is no abdominal tenderness.  Musculoskeletal:        General: No swelling.     Cervical back: Neck supple.  Skin:    General: Skin is warm and dry.     Capillary Refill: Capillary refill takes less than 2 seconds.  Neurological:     Mental Status: She is alert.  Psychiatric:        Mood and Affect: Mood  normal.     ED Results / Procedures / Treatments   Labs (all labs ordered are listed, but only abnormal results are displayed) Labs Reviewed  RESP PANEL BY RT-PCR (RSV, FLU A&B, COVID)  [id number]    EKG None  Radiology No results found.  Procedures Procedures    Medications Ordered in ED Medications - No data to display  ED Course/ Medical Decision Making/ A&P                                 Medical Decision Making Risk OTC drugs. Prescription drug management.   This patient presents to the ED for concern of ear pressure, fullness, this involves an extensive number  of treatment options, and is a complaint that carries with it a high risk of complications and morbidity.  The differential diagnosis includes viral URI, COVID, flu, RSV, pneumonia, sinusitis, otitis media, otitis externa,   Co morbidities that complicate the patient evaluation  See HPI   Additional history obtained:  Additional history obtained from EMR External records from outside source obtained and reviewed including hospital records   Lab Tests:  I Ordered, and personally interpreted labs.  The pertinent results include: Viral testing which is pending   Imaging Studies ordered:  N/a   Cardiac Monitoring: / EKG:  N/a   Consultations Obtained:  N/a   Problem List / ED Course / Critical interventions / Medication management  Sinus pressure, nasal congestion, ear fullness Reevaluation of the patient showed that the patient stayed the same I have reviewed the patients home medicines and have made adjustments as needed   Social Determinants of Health:  Vape use.  Denies illicit drug use.   Test / Admission - Considered:  Sinus pressure, nasal congestion, ear fullness Vitals signs within normal range and stable throughout visit. Laboratory studies significant for: see above 23 year old female presents emergency department with complaints of nasal congestion, facial pressure, ear fullness.  States that symptoms been present for the past week to week and a half.  Has been trying over-the-counter Mucinex, nasal sprays without significant improvement.  Presents emergency department for further assessment.  Denies any known sick contact.  Denies any fever, chest pain, shortness of breath, abdominal pain, nausea, vomiting.  States she has history of sinus infection and feels similar. On exam, bilateral tender effusion with purulent appearing left side with erythematous TM when compared to right.  Maxillary sinus tenderness to palpation.  Viral testing pending at this  time.  Given patient's 10+ days of symptoms, will treat empirically for bacterial etiology of sinusitis with Augmentin .  Will recommend additional supportive therapy as described in AVS and close follow-up with PCP in the outpatient setting.  Treatment plan discussed with patient and she acknowledged understanding was agreeable to said plan.  Patient overall well-appearing, afebrile in no acute distress. Worrisome signs and symptoms were discussed with the patient, and the patient acknowledged understanding to return to the ED if noticed. Patient was stable upon discharge.          Final Clinical Impression(s) / ED Diagnoses Final diagnoses:  Acute non-recurrent frontal sinusitis  Nasal congestion  Pressure sensation in both ears    Rx / DC Orders      [place] Butter, PA 01/05/24 1729    Lowery Rue, DO 01/05/24 2325

## 2024-01-05 NOTE — ED Triage Notes (Signed)
 C/o bilateral ear fullness. States had sinus infection x 1 week. Denies SHOB .

## 2024-01-14 ENCOUNTER — Other Ambulatory Visit: Payer: Self-pay

## 2024-01-14 ENCOUNTER — Encounter (HOSPITAL_BASED_OUTPATIENT_CLINIC_OR_DEPARTMENT_OTHER): Payer: Self-pay | Admitting: Emergency Medicine

## 2024-01-14 DIAGNOSIS — D72829 Elevated white blood cell count, unspecified: Secondary | ICD-10-CM | POA: Diagnosis not present

## 2024-01-14 DIAGNOSIS — K625 Hemorrhage of anus and rectum: Secondary | ICD-10-CM | POA: Diagnosis not present

## 2024-01-14 DIAGNOSIS — R1032 Left lower quadrant pain: Secondary | ICD-10-CM | POA: Insufficient documentation

## 2024-01-14 DIAGNOSIS — R109 Unspecified abdominal pain: Secondary | ICD-10-CM | POA: Diagnosis not present

## 2024-01-14 LAB — URINALYSIS, ROUTINE W REFLEX MICROSCOPIC
Bacteria, UA: NONE SEEN
Bilirubin Urine: NEGATIVE
Glucose, UA: NEGATIVE mg/dL
Ketones, ur: NEGATIVE mg/dL
Leukocytes,Ua: NEGATIVE
Nitrite: NEGATIVE
Specific Gravity, Urine: 1.027 (ref 1.005–1.030)
pH: 5.5 (ref 5.0–8.0)

## 2024-01-14 LAB — PREGNANCY, URINE: Preg Test, Ur: NEGATIVE

## 2024-01-14 LAB — COMPREHENSIVE METABOLIC PANEL WITH GFR
ALT: 34 U/L (ref 0–44)
AST: 24 U/L (ref 15–41)
Albumin: 4.4 g/dL (ref 3.5–5.0)
Alkaline Phosphatase: 97 U/L (ref 38–126)
Anion gap: 14 (ref 5–15)
BUN: 8 mg/dL (ref 6–20)
CO2: 22 mmol/L (ref 22–32)
Calcium: 9.8 mg/dL (ref 8.9–10.3)
Chloride: 104 mmol/L (ref 98–111)
Creatinine, Ser: 0.69 mg/dL (ref 0.44–1.00)
GFR, Estimated: >60 mL/min (ref >60)
Glucose, Bld: 118 mg/dL — ABNORMAL HIGH (ref 70–99)
Potassium: 3.8 mmol/L (ref 3.5–5.1)
Sodium: 140 mmol/L (ref 135–145)
Total Bilirubin: 0.4 mg/dL (ref 0.0–1.2)
Total Protein: 8 g/dL (ref 6.5–8.1)

## 2024-01-14 LAB — CBC
HCT: 41 % (ref 36.0–46.0)
Hemoglobin: 13.6 g/dL (ref 12.0–15.0)
MCH: 30 pg (ref 26.0–34.0)
MCHC: 33.2 g/dL (ref 30.0–36.0)
MCV: 90.5 fL (ref 80.0–100.0)
Platelets: 346 10*3/uL (ref 150–400)
RBC: 4.53 MIL/uL (ref 3.87–5.11)
RDW: 12.9 % (ref 11.5–15.5)
WBC: 20.2 10*3/uL — ABNORMAL HIGH (ref 4.0–10.5)
nRBC: 0 % (ref 0.0–0.2)

## 2024-01-14 LAB — LIPASE, BLOOD: Lipase: 39 U/L (ref 11–51)

## 2024-01-14 NOTE — ED Triage Notes (Signed)
 Constipation, painful BM noted red blood on stool when passing. Lefts side abdo pain Worse today

## 2024-01-15 ENCOUNTER — Emergency Department (HOSPITAL_BASED_OUTPATIENT_CLINIC_OR_DEPARTMENT_OTHER)
Admission: EM | Admit: 2024-01-15 | Discharge: 2024-01-15 | Disposition: A | Discharge status: Home / Self Care | Attending: Emergency Medicine | Admitting: Emergency Medicine

## 2024-01-15 ENCOUNTER — Emergency Department (HOSPITAL_BASED_OUTPATIENT_CLINIC_OR_DEPARTMENT_OTHER)

## 2024-01-15 DIAGNOSIS — K5900 Constipation, unspecified: Secondary | ICD-10-CM | POA: Diagnosis not present

## 2024-01-15 DIAGNOSIS — R1032 Left lower quadrant pain: Secondary | ICD-10-CM

## 2024-01-15 DIAGNOSIS — R109 Unspecified abdominal pain: Secondary | ICD-10-CM | POA: Diagnosis not present

## 2024-01-15 DIAGNOSIS — K625 Hemorrhage of anus and rectum: Secondary | ICD-10-CM

## 2024-01-15 MED ORDER — SODIUM CHLORIDE 0.9 % IV BOLUS
1000.0000 mL | Freq: Once | INTRAVENOUS | Status: AC
  Administered 2024-01-15: 1000 mL via INTRAVENOUS

## 2024-01-15 MED ORDER — IOHEXOL 300 MG/ML  SOLN
100.0000 mL | Freq: Once | INTRAMUSCULAR | Status: AC | PRN
  Administered 2024-01-15: 85 mL via INTRAVENOUS

## 2024-01-15 MED ORDER — MORPHINE SULFATE (PF) 4 MG/ML IV SOLN
4.0000 mg | Freq: Once | INTRAVENOUS | Status: AC
  Administered 2024-01-15: 4 mg via INTRAVENOUS
  Filled 2024-01-15: qty 1

## 2024-01-15 MED ORDER — ONDANSETRON HCL 4 MG/2ML IJ SOLN
4.0000 mg | Freq: Once | INTRAMUSCULAR | Status: AC
  Administered 2024-01-15: 4 mg via INTRAVENOUS
  Filled 2024-01-15: qty 2

## 2024-01-15 NOTE — Discharge Instructions (Signed)
 Begin taking Metamucil 1 heaping teaspoon in a glass of water 3 times daily.  Begin taking Colace (equal 8 stool softener) 100 mg twice daily.  Follow-up with gastroenterology in the next week.  The contact information for [facility] has been provided in this discharge summary for you to call and make these arrangements.  Return to the ER if symptoms significantly worsen or change.

## 2024-01-15 NOTE — ED Provider Notes (Signed)
 [place] EMERGENCY DEPARTMENT AT [facility] Provider Note   CSN: [id number] Arrival date & time: 01/14/24  2144     History  Chief Complaint  Patient presents with   Abdominal Pain   Constipation    April Lara is a 23 y.o. female.  Patient is a 23 year old female with no significant past medical history.  Patient presenting today with complaints of abdominal pain.  She describes a 2-day history of left-sided pain that worsened significantly today.  She describes discomfort with bowel movements and also states that she passes a small amount of blood when she wipes.  She denies fevers or chills.  No ill contacts.  Pain worse with movement and palpation and stooling, with no alleviating factors.       Home Medications Prior to Admission medications   Medication Sig Start Date End Date Taking? Authorizing Provider  amoxicillin -clavulanate (AUGMENTIN ) 875-125 MG tablet Take 1 tablet by mouth every 12 (twelve) hours. 01/05/24   Neil Balls A, PA  AUROVELA 1.5/30 1.5-30 MG-MCG tablet TAKE 1 TABLET BY MOUTH EVERY DAY 11/30/23   Dema Filler, MD  cetirizine  (ZYRTEC ) 10 MG tablet Take 1 tablet (10 mg total) by mouth daily. For allergies 06/04/21   Ettefagh, Micah Ade, MD  cetirizine -pseudoephedrine  (ZYRTEC -D) 5-120 MG tablet Take 1 tablet by mouth daily as needed for rhinitis or allergies. 01/05/24   [place] Butter, PA  fluticasone  (FLONASE ) 50 MCG/ACT nasal spray Place 1-2 sprays into both nostrils daily. 06/04/21   Ettefagh, Micah Ade, MD      Allergies    Patient has no known allergies.    Review of Systems   Review of Systems  All other systems reviewed and are negative.   Physical Exam Updated Vital Signs BP 107/69   Pulse 86   Temp 98.6 F (37 C) (Oral)   Resp 18   LMP 01/04/2024   SpO2 99%  Physical Exam Vitals and nursing note reviewed.  Constitutional:      General: She is not in acute distress.    Appearance: She is  well-developed. She is not diaphoretic.  HENT:     Head: Normocephalic and atraumatic.  Cardiovascular:     Rate and Rhythm: Normal rate and regular rhythm.     Heart sounds: No murmur heard.    No friction rub. No gallop.  Pulmonary:     Effort: Pulmonary effort is normal. No respiratory distress.     Breath sounds: Normal breath sounds. No wheezing.  Abdominal:     General: Bowel sounds are normal. There is no distension.     Palpations: Abdomen is soft.     Tenderness: There is abdominal tenderness in the left lower quadrant. There is no right CVA tenderness, left CVA tenderness, guarding or rebound.  Musculoskeletal:        General: Normal range of motion.     Cervical back: Normal range of motion and neck supple.  Skin:    General: Skin is warm and dry.  Neurological:     General: No focal deficit present.     Mental Status: She is alert and oriented to person, place, and time.     ED Results / Procedures / Treatments   Labs (all labs ordered are listed, but only abnormal results are displayed) Labs Reviewed  COMPREHENSIVE METABOLIC PANEL WITH GFR - Abnormal; Notable for the following components:      Result Value   Glucose, Bld 118 (*)    All other components  within normal limits  CBC - Abnormal; Notable for the following components:   WBC 20.2 (*)    All other components within normal limits  URINALYSIS, ROUTINE W REFLEX MICROSCOPIC - Abnormal; Notable for the following components:   Hgb urine dipstick SMALL (*)    Protein, ur TRACE (*)    All other components within normal limits  LIPASE, BLOOD  PREGNANCY, URINE    EKG None  Radiology No results found.  Procedures Procedures    Medications Ordered in ED Medications  sodium chloride 0.9 % bolus 1,000 mL (has no administration in time range)  ondansetron  (ZOFRAN ) injection 4 mg (has no administration in time range)  morphine (PF) 4 MG/ML injection 4 mg (has no administration in time range)    ED  Course/ Medical Decision Making/ A&P  Patient is a 23 year old female presenting with left lower quadrant pain, constipation, and pain with defecation.  She denies fevers or chills.  Vital signs are stable and patient is afebrile.  Laboratory studies obtained including CBC, CMP, and lipase.  She has a white count of 20.2, but remainder of laboratory studies are unremarkable.  Urinalysis inconsistent with infection and pregnancy test is negative.  CT scan of the abdomen and pelvis obtained showing no acute intra-abdominal process.  She has a normal colonic stool burden.  Patient has received IV fluids along with morphine for pain and Zofran  for nausea and seems to be feeling better.  The cause of her discomfort is unclear at this time, but nothing appears emergent.  She does have an elevated white count, the etiology of which I am uncertain.  She is currently receiving treatment for a sinus infection and tells me she was prescribed a nasal spray.  Perhaps this was a steroid that has elevated her white count.  Either way nothing seems emergent.  Due to the tenesmus and blood when she wipes, I will refer her to GI and start her on stool softeners as this may represent a fissure.  Final Clinical Impression(s) / ED Diagnoses Final diagnoses:  None    Rx / DC Orders ED Discharge Orders     None         Orvilla Blander, MD 01/15/24 [phone]

## 2024-01-21 DIAGNOSIS — D72829 Elevated white blood cell count, unspecified: Secondary | ICD-10-CM | POA: Diagnosis not present

## 2024-01-21 DIAGNOSIS — R198 Other specified symptoms and signs involving the digestive system and abdomen: Secondary | ICD-10-CM | POA: Diagnosis not present

## 2024-01-21 DIAGNOSIS — K862 Cyst of pancreas: Secondary | ICD-10-CM | POA: Diagnosis not present

## 2024-01-21 DIAGNOSIS — R109 Unspecified abdominal pain: Secondary | ICD-10-CM | POA: Diagnosis not present

## 2024-01-22 ENCOUNTER — Ambulatory Visit (INDEPENDENT_AMBULATORY_CARE_PROVIDER_SITE_OTHER): Admitting: Family Medicine

## 2024-01-22 ENCOUNTER — Encounter: Payer: Self-pay | Admitting: Family Medicine

## 2024-01-22 ENCOUNTER — Other Ambulatory Visit (HOSPITAL_COMMUNITY)
Admission: RE | Admit: 2024-01-22 | Discharge: 2024-01-22 | Disposition: A | Discharge status: Home / Self Care | Source: Ambulatory Visit | Attending: Family Medicine | Admitting: Family Medicine

## 2024-01-22 VITALS — BP 114/80 | HR 118 | Ht 62.0 in | Wt 92.8 lb

## 2024-01-22 DIAGNOSIS — Z0000 Encounter for general adult medical examination without abnormal findings: Secondary | ICD-10-CM | POA: Diagnosis not present

## 2024-01-22 DIAGNOSIS — Z113 Encounter for screening for infections with a predominantly sexual mode of transmission: Secondary | ICD-10-CM | POA: Diagnosis not present

## 2024-01-22 NOTE — Patient Instructions (Addendum)
 It was wonderful to see you today! We are checking some labs, I will inform you if these are abnormal.    Things to do to Keep yourself Healthy - Exercise at least 30-45 minutes a day, 3-4 days a week. >150 min of moderate intensity per week is advised. - Eat a low-fat diet with lots of fruits and vegetables, up to 7-9 servings per day. - Seatbelts can save your life. Wear them always. - Smoke detectors on every level of your home, check batteries every year. - Eye Doctor - have an eye exam every 1-2 years - Safe sex - if you may be exposed to STDs, use a condom. - Alcohol If you drink, do it moderately, less than 1 drink per day. - Health Care Power of Attorney.  Choose someone to speak for you if you are not able. - Depression is common in our stressful world.If you're feeling down or losing interest in things you normally enjoy, please come in for a visit. - Violence - If anyone is threatening or hurting you, please call immediately.   Return in about 1 year (around 01/21/2025).
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
## 2024-01-25 LAB — RPR

## 2024-01-25 LAB — CERVICOVAGINAL ANCILLARY ONLY
Chlamydia: NEGATIVE
Comment: NEGATIVE
Comment: NEGATIVE
Comment: NORMAL
Neisseria Gonorrhea: NEGATIVE
Trichomonas: NEGATIVE

## 2024-01-27 ENCOUNTER — Ambulatory Visit: Payer: Self-pay | Admitting: Family Medicine

## 2024-01-28 ENCOUNTER — Other Ambulatory Visit

## 2024-01-28 DIAGNOSIS — Z113 Encounter for screening for infections with a predominantly sexual mode of transmission: Secondary | ICD-10-CM | POA: Diagnosis not present

## 2024-01-28 LAB — RPR

## 2024-01-28 LAB — HIV ANTIBODY (ROUTINE TESTING W REFLEX)

## 2024-01-29 LAB — RPR: RPR Ser Ql: NONREACTIVE

## 2024-01-29 LAB — HIV ANTIBODY (ROUTINE TESTING W REFLEX): HIV Screen 4th Generation wRfx: NONREACTIVE

## 2024-02-29 ENCOUNTER — Ambulatory Visit (INDEPENDENT_AMBULATORY_CARE_PROVIDER_SITE_OTHER): Admitting: Family Medicine

## 2024-02-29 VITALS — BP 108/76 | HR 99 | Ht 62.0 in | Wt 94.8 lb

## 2024-02-29 DIAGNOSIS — G5601 Carpal tunnel syndrome, right upper limb: Secondary | ICD-10-CM | POA: Diagnosis not present

## 2024-02-29 DIAGNOSIS — M792 Neuralgia and neuritis, unspecified: Secondary | ICD-10-CM

## 2024-02-29 NOTE — Progress Notes (Signed)
    SUBJECTIVE:   CHIEF COMPLAINT / HPI:   Flare of carpal tunnel X 1 year has been having intermittent flares Today flared up bad R hand Works on computer 8+ hrs daily Needs work note for today Uses neutral brace, had not been using regularly Reports tingling/numbness in middle and ring finger of R hand  Took Aleve this morning with minimal relief   PERTINENT  PMH / PSH: n/a  OBJECTIVE:   BP 108/76   Pulse 99   Ht 5' 2 (1.575 m)   Wt 94 lb 12.8 oz (43 kg)   SpO2 98%   BMI 17.34 kg/m   General: NAD, pleasant, able to participate in exam Respiratory: No respiratory distress Skin: warm and dry, no rashes noted Psych: Normal affect and mood  MSK R hand: No gross deformity, ecchymoses, swelling Nontender to palpation throughout FROM Normal grip strength Negative Tinel, + phalen with reported numbness in middle and ring finger   ASSESSMENT/PLAN:   Assessment & Plan Neuropathic pain Atypical distrubution for carpal tunnel but feel this is most likely given symptoms and +phalen Continue neutral bracing NSAIDs and Tylenol  prn OTC If having recurrent flares, consider sports med referral vs nerve conduction studies Provided work note   Payton Coward, MD [facility]

## 2024-02-29 NOTE — Patient Instructions (Signed)
 Take ibuprofen/naproxen and/or tylenol  as needed for your pain   Keep your brace on and use it at night to help prevent flares  Let me know if symptoms worsen or do not improve

## 2024-03-03 ENCOUNTER — Other Ambulatory Visit: Payer: Self-pay | Admitting: Family Medicine

## 2024-03-03 DIAGNOSIS — Z30011 Encounter for initial prescription of contraceptive pills: Secondary | ICD-10-CM

## 2024-06-11 ENCOUNTER — Other Ambulatory Visit: Payer: Self-pay | Admitting: Family Medicine

## 2024-06-11 DIAGNOSIS — Z30011 Encounter for initial prescription of contraceptive pills: Secondary | ICD-10-CM

## 2024-06-14 ENCOUNTER — Telehealth: Payer: Self-pay

## 2024-06-14 NOTE — Telephone Encounter (Signed)
 Patient calls nurse line in regards to [facility] prescription.   She reports she picked this up at the pharmacy, however was given something else, she reports Junel.  She is unsure why this happened. Advised sometimes pharmacies dispense OCPs based of stock and insurance.   Advise she would need to call them for more details.   Patient to call back with any further questions.

## 2024-06-29 DIAGNOSIS — F4322 Adjustment disorder with anxiety: Secondary | ICD-10-CM | POA: Diagnosis not present
